# Patient Record
Sex: Female | Born: 1990 | Race: White | Hispanic: No | Marital: Single | Smoking: Never smoker
Health system: Southern US, Community
[De-identification: ages and names within clinical notes are randomized; demographics above are authoritative.]

## PROBLEM LIST (undated history)

## (undated) ENCOUNTER — Inpatient Hospital Stay (HOSPITAL_COMMUNITY): Payer: Self-pay

## (undated) DIAGNOSIS — O139 Gestational [pregnancy-induced] hypertension without significant proteinuria, unspecified trimester: Secondary | ICD-10-CM

## (undated) HISTORY — PX: DILATION AND CURETTAGE OF UTERUS: SHX78

## (undated) HISTORY — PX: WISDOM TOOTH EXTRACTION: SHX21

---
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
## 2016-06-09 DIAGNOSIS — Z6791 Unspecified blood type, Rh negative: Secondary | ICD-10-CM

## 2016-06-09 DIAGNOSIS — O26899 Other specified pregnancy related conditions, unspecified trimester: Secondary | ICD-10-CM

## 2016-06-09 HISTORY — DX: Other specified pregnancy related conditions, unspecified trimester: O26.899

## 2016-06-09 HISTORY — DX: Unspecified blood type, rh negative: Z67.91

## 2016-09-26 ENCOUNTER — Other Ambulatory Visit (HOSPITAL_COMMUNITY): Payer: Self-pay | Admitting: *Deleted

## 2016-09-26 ENCOUNTER — Encounter (HOSPITAL_COMMUNITY): Payer: Self-pay

## 2016-09-26 ENCOUNTER — Ambulatory Visit (HOSPITAL_COMMUNITY)
Admission: RE | Admit: 2016-09-26 | Discharge: 2016-09-26 | Disposition: A | Discharge status: Home / Self Care | Payer: Commercial Managed Care - PPO | Source: Ambulatory Visit | Attending: Obstetrics and Gynecology | Admitting: Obstetrics and Gynecology

## 2016-09-26 ENCOUNTER — Other Ambulatory Visit (HOSPITAL_COMMUNITY): Payer: Self-pay | Admitting: Obstetrics and Gynecology

## 2016-09-26 DIAGNOSIS — Z3689 Encounter for other specified antenatal screening: Secondary | ICD-10-CM | POA: Insufficient documentation

## 2016-09-26 DIAGNOSIS — O26872 Cervical shortening, second trimester: Secondary | ICD-10-CM

## 2016-09-26 DIAGNOSIS — O30042 Twin pregnancy, dichorionic/diamniotic, second trimester: Secondary | ICD-10-CM | POA: Diagnosis not present

## 2016-09-26 DIAGNOSIS — Z369 Encounter for antenatal screening, unspecified: Secondary | ICD-10-CM

## 2016-09-26 DIAGNOSIS — Z3A23 23 weeks gestation of pregnancy: Secondary | ICD-10-CM | POA: Insufficient documentation

## 2016-09-26 DIAGNOSIS — O99212 Obesity complicating pregnancy, second trimester: Secondary | ICD-10-CM | POA: Insufficient documentation

## 2016-09-26 DIAGNOSIS — O30049 Twin pregnancy, dichorionic/diamniotic, unspecified trimester: Secondary | ICD-10-CM

## 2016-09-26 DIAGNOSIS — O26879 Cervical shortening, unspecified trimester: Secondary | ICD-10-CM

## 2016-09-26 HISTORY — DX: Cervical shortening, unspecified trimester: O26.879

## 2016-09-26 NOTE — Progress Notes (Signed)
Maternal Fetal Medicine Consultation  Requesting Provider(s): Mannino  Primary OB: [facility] ([facility]) Reason for consultation: diamniotic dichorionic twin gestation with short cervix  HPI: 26yo P0010 at 23+3 weeks with diamniotic dichorionic twin gestation who was seen for routine PNV and scan in her primary OB's office and no measurable cervix was seen on US. Emergency US and consultation was scheduled for today. She reports increasing pelvic pressure that comes and goes but no recognizable uterine cramps or contractions. OB History: OB History    Gravida Para Term Preterm AB Living   2       1 0   SAB TAB Ectopic Multiple Live Births   1              PMH: No past medical history on file.  PSH:  Past Surgical History:  Procedure Laterality Date  . DILATION AND CURETTAGE OF UTERUS    . 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 No tobacco, alcohol or drugs  Review of Systems: no vaginal bleeding or cramping/contractions, no LOF, no nausea/vomiting. All other systems reviewed and are negative.  PE:  VS: BP  125/79                  pulse   117                Weight 228.4 lb GEN: well-appearing female ABD: gravid, NT  Please see separate document for fetal ultrasound report.  A/P:Diamniotic dichorionic twin gestation with dynamic cervix The cervix measured between 6mm and 18mm in our transvaginal evaluation. Given the patient's reported symptoms, increased uterine activity is most likely the cause for the cervical changes. I have requested that she go to modified bedrest (20/24 hours as supine as possible), begin a 3-day course of ibuprofen 600mg  q6h, begin vaginal micronized progesterone 200mg  qHS, and procardia 10mg  po q6h. Given her symptoms, her twin gestation, and her late EGA she is not really a candidate for cerclage placement We will repeat her transvaginal evaluation in 1 week.  If her cervix becomes consistently shorter, especial if <5-6710mm, inpatient evaluation and management can be considered  Thank you for the opportunity to be a part of the care of Smithfield FoodsHolly Stallone. Please contact our office if we can be of further assistance.   I spent approximately 30 minutes with this patient with over 50% of time spent in face-to-face counseling.

## 2016-10-03 ENCOUNTER — Encounter (HOSPITAL_COMMUNITY): Payer: Self-pay

## 2016-10-03 ENCOUNTER — Ambulatory Visit (HOSPITAL_COMMUNITY)
Admission: RE | Admit: 2016-10-03 | Discharge: 2016-10-03 | Disposition: A | Discharge status: Home / Self Care | Payer: Commercial Managed Care - PPO | Source: Ambulatory Visit | Attending: Obstetrics and Gynecology | Admitting: Obstetrics and Gynecology

## 2016-10-03 ENCOUNTER — Other Ambulatory Visit (HOSPITAL_COMMUNITY): Payer: Self-pay | Admitting: Obstetrics and Gynecology

## 2016-10-03 DIAGNOSIS — O30049 Twin pregnancy, dichorionic/diamniotic, unspecified trimester: Secondary | ICD-10-CM

## 2016-10-03 DIAGNOSIS — Z6841 Body Mass Index (BMI) 40.0 and over, adult: Secondary | ICD-10-CM | POA: Insufficient documentation

## 2016-10-03 DIAGNOSIS — E669 Obesity, unspecified: Secondary | ICD-10-CM | POA: Diagnosis not present

## 2016-10-03 DIAGNOSIS — O26872 Cervical shortening, second trimester: Secondary | ICD-10-CM

## 2016-10-03 DIAGNOSIS — O99212 Obesity complicating pregnancy, second trimester: Secondary | ICD-10-CM | POA: Diagnosis not present

## 2016-10-03 DIAGNOSIS — Z3A24 24 weeks gestation of pregnancy: Secondary | ICD-10-CM

## 2016-10-03 DIAGNOSIS — O30042 Twin pregnancy, dichorionic/diamniotic, second trimester: Secondary | ICD-10-CM | POA: Insufficient documentation

## 2016-10-03 NOTE — Addendum Note (Signed)
Encounter addended by: Levonne HubertStalter, Osamu Olguin M on: 10/03/2016  4:28 PM<BR>    Actions taken: Imaging Exam ended

## 2016-10-10 ENCOUNTER — Encounter (HOSPITAL_COMMUNITY): Payer: Self-pay

## 2016-10-10 ENCOUNTER — Ambulatory Visit (HOSPITAL_COMMUNITY)
Admission: RE | Admit: 2016-10-10 | Discharge: 2016-10-10 | Disposition: A | Discharge status: Home / Self Care | Payer: Commercial Managed Care - PPO | Source: Ambulatory Visit | Attending: Obstetrics and Gynecology | Admitting: Obstetrics and Gynecology

## 2016-10-10 DIAGNOSIS — O30042 Twin pregnancy, dichorionic/diamniotic, second trimester: Secondary | ICD-10-CM | POA: Insufficient documentation

## 2016-10-10 DIAGNOSIS — O26872 Cervical shortening, second trimester: Secondary | ICD-10-CM | POA: Diagnosis present

## 2016-10-10 DIAGNOSIS — O09892 Supervision of other high risk pregnancies, second trimester: Secondary | ICD-10-CM | POA: Insufficient documentation

## 2016-10-10 DIAGNOSIS — O30049 Twin pregnancy, dichorionic/diamniotic, unspecified trimester: Secondary | ICD-10-CM

## 2016-10-10 DIAGNOSIS — Z3A25 25 weeks gestation of pregnancy: Secondary | ICD-10-CM | POA: Diagnosis present

## 2016-10-10 DIAGNOSIS — O99212 Obesity complicating pregnancy, second trimester: Secondary | ICD-10-CM | POA: Diagnosis not present

## 2016-10-17 ENCOUNTER — Other Ambulatory Visit (HOSPITAL_COMMUNITY): Payer: Self-pay | Admitting: Obstetrics and Gynecology

## 2016-10-17 ENCOUNTER — Ambulatory Visit (HOSPITAL_COMMUNITY)
Admission: RE | Admit: 2016-10-17 | Discharge: 2016-10-17 | Disposition: A | Discharge status: Home / Self Care | Payer: Commercial Managed Care - PPO | Source: Ambulatory Visit | Attending: Obstetrics and Gynecology | Admitting: Obstetrics and Gynecology

## 2016-10-17 ENCOUNTER — Encounter (HOSPITAL_COMMUNITY): Payer: Self-pay

## 2016-10-17 DIAGNOSIS — O30049 Twin pregnancy, dichorionic/diamniotic, unspecified trimester: Secondary | ICD-10-CM

## 2016-10-17 DIAGNOSIS — Z3A26 26 weeks gestation of pregnancy: Secondary | ICD-10-CM | POA: Insufficient documentation

## 2016-10-17 DIAGNOSIS — O30042 Twin pregnancy, dichorionic/diamniotic, second trimester: Secondary | ICD-10-CM | POA: Diagnosis present

## 2016-10-17 DIAGNOSIS — N883 Incompetence of cervix uteri: Secondary | ICD-10-CM

## 2016-10-19 ENCOUNTER — Other Ambulatory Visit (HOSPITAL_COMMUNITY): Payer: Self-pay | Admitting: *Deleted

## 2016-10-19 DIAGNOSIS — O30049 Twin pregnancy, dichorionic/diamniotic, unspecified trimester: Secondary | ICD-10-CM

## 2016-10-19 DIAGNOSIS — O26879 Cervical shortening, unspecified trimester: Secondary | ICD-10-CM

## 2016-10-24 ENCOUNTER — Encounter (HOSPITAL_COMMUNITY): Payer: Self-pay

## 2016-10-24 ENCOUNTER — Ambulatory Visit (HOSPITAL_COMMUNITY)
Admission: RE | Admit: 2016-10-24 | Discharge: 2016-10-24 | Disposition: A | Discharge status: Home / Self Care | Payer: Commercial Managed Care - PPO | Source: Ambulatory Visit | Attending: Obstetrics and Gynecology | Admitting: Obstetrics and Gynecology

## 2016-10-24 ENCOUNTER — Inpatient Hospital Stay (HOSPITAL_COMMUNITY)
Admission: AD | Admit: 2016-10-24 | Discharge: 2016-10-24 | Disposition: A | Discharge status: Home / Self Care | Payer: Commercial Managed Care - PPO | Source: Ambulatory Visit | Attending: Family Medicine | Admitting: Family Medicine

## 2016-10-24 DIAGNOSIS — O09892 Supervision of other high risk pregnancies, second trimester: Secondary | ICD-10-CM | POA: Diagnosis present

## 2016-10-24 DIAGNOSIS — O26872 Cervical shortening, second trimester: Secondary | ICD-10-CM

## 2016-10-24 DIAGNOSIS — O30042 Twin pregnancy, dichorionic/diamniotic, second trimester: Secondary | ICD-10-CM

## 2016-10-24 DIAGNOSIS — O26879 Cervical shortening, unspecified trimester: Secondary | ICD-10-CM

## 2016-10-24 DIAGNOSIS — O30049 Twin pregnancy, dichorionic/diamniotic, unspecified trimester: Secondary | ICD-10-CM

## 2016-10-24 DIAGNOSIS — O99212 Obesity complicating pregnancy, second trimester: Secondary | ICD-10-CM | POA: Insufficient documentation

## 2016-10-24 DIAGNOSIS — O1492 Unspecified pre-eclampsia, second trimester: Secondary | ICD-10-CM

## 2016-10-24 DIAGNOSIS — Z3A27 27 weeks gestation of pregnancy: Secondary | ICD-10-CM

## 2016-10-24 DIAGNOSIS — O132 Gestational [pregnancy-induced] hypertension without significant proteinuria, second trimester: Secondary | ICD-10-CM | POA: Diagnosis not present

## 2016-10-24 HISTORY — DX: Gestational (pregnancy-induced) hypertension without significant proteinuria, unspecified trimester: O13.9

## 2016-10-24 LAB — CBC
HCT: 36.9 % (ref 36.0–46.0)
HEMOGLOBIN: 12.9 g/dL (ref 12.0–15.0)
MCH: 30.5 pg (ref 26.0–34.0)
MCHC: 35 g/dL (ref 30.0–36.0)
MCV: 87.2 fL (ref 78.0–100.0)
PLATELETS: 254 10*3/uL (ref 150–400)
RBC: 4.23 MIL/uL (ref 3.87–5.11)
RDW: 14.2 % (ref 11.5–15.5)
WBC: 13.6 10*3/uL — AB (ref 4.0–10.5)

## 2016-10-24 LAB — COMPREHENSIVE METABOLIC PANEL
ALT: 12 U/L — AB (ref 14–54)
ANION GAP: 11 (ref 5–15)
AST: 19 U/L (ref 15–41)
Albumin: 2.9 g/dL — ABNORMAL LOW (ref 3.5–5.0)
Alkaline Phosphatase: 128 U/L — ABNORMAL HIGH (ref 38–126)
BUN: 9 mg/dL (ref 6–20)
CALCIUM: 9 mg/dL (ref 8.9–10.3)
CHLORIDE: 105 mmol/L (ref 101–111)
CO2: 20 mmol/L — AB (ref 22–32)
CREATININE: 0.63 mg/dL (ref 0.44–1.00)
Glucose, Bld: 195 mg/dL — ABNORMAL HIGH (ref 65–99)
Potassium: 4.4 mmol/L (ref 3.5–5.1)
SODIUM: 136 mmol/L (ref 135–145)
Total Bilirubin: 0.2 mg/dL — ABNORMAL LOW (ref 0.3–1.2)
Total Protein: 6.4 g/dL — ABNORMAL LOW (ref 6.5–8.1)

## 2016-10-24 LAB — PROTEIN / CREATININE RATIO, URINE
CREATININE, URINE: 74 mg/dL
PROTEIN CREATININE RATIO: 0.3 mg/mg{creat} — AB (ref 0.00–0.15)
Total Protein, Urine: 22 mg/dL

## 2016-10-24 MED ORDER — LACTATED RINGERS IV SOLN
INTRAVENOUS | Status: DC | PRN
  Administered 2016-10-24: 17:00:00 via INTRAVENOUS

## 2016-10-24 MED ORDER — HYDRALAZINE HCL 20 MG/ML IJ SOLN: 10.0000 mg | Freq: Once | INTRAMUSCULAR | Status: DC | PRN

## 2016-10-24 MED ORDER — LABETALOL HCL 5 MG/ML IV SOLN: 20.0000 mg | INTRAVENOUS | Status: DC | PRN

## 2016-10-24 MED ORDER — BETAMETHASONE SOD PHOS & ACET 6 (3-3) MG/ML IJ SUSP
12.0000 mg | Freq: Once | INTRAMUSCULAR | Status: AC
  Administered 2016-10-24: 12 mg via INTRAMUSCULAR
  Filled 2016-10-24: qty 2

## 2016-10-24 NOTE — ED Notes (Signed)
Report called to [facility], RN in MAU.  Pt/family member ambulated and signed into MAU per Dr. Ezzard Standing for further evaluation.

## 2016-10-24 NOTE — MAU Provider Note (Signed)
History     CSN: [id number]  Arrival date and time: 10/24/16 1559  First Provider Initiated Contact with Patient 10/24/16 1653      Chief Complaint  Patient presents with  . Hypertension   HPI Anita Peters is a 26 y.o. G2P0010 at [redacted]w[redacted]d with di/di twins who presents for BP evaluation. Goes to [employer] in [place]. Was in MFM today for ultrasound for twin pregnancy & cervical insufficiency. Has severe range BP in MFM & brought to MAU for labs. Denies history of hypertension. Has elevated BP in office this morning for first time. Reports mild headache this morning that resolved after tylenol. Denies headache, visual disturbance, or epigastric pain.   OB History    Gravida Para Term Preterm AB Living   2       1 0   SAB TAB Ectopic Multiple Live Births   1              Past Medical History:  Diagnosis Date  . Pregnancy induced hypertension     Past Surgical History:  Procedure Laterality Date  . DILATION AND CURETTAGE OF UTERUS    . WISDOM TOOTH EXTRACTION      Family History  Problem Relation Age of Onset  . Diabetes Mother   . Diabetes Maternal Uncle   . Diabetes Maternal Grandmother     Social History  Substance Use Topics  . Smoking status: Never Smoker  . Smokeless tobacco: Never Used  . Alcohol use No    Allergies: No Known Allergies  Prescriptions Prior to Admission  Medication Sig Dispense Refill Last Dose  . acetaminophen (TYLENOL) 500 MG tablet Take 500 mg by mouth every 6 (six) hours as needed for moderate pain.   10/24/2016 at Unknown time  . calcium carbonate (TUMS - DOSED IN MG ELEMENTAL CALCIUM) 500 MG chewable tablet Chew 1 tablet by mouth 3 (three) times daily as needed for indigestion or heartburn.   Past Week at Unknown time  . docusate sodium (COLACE) 100 MG capsule Take 100 mg by mouth daily.   10/23/2016 at Unknown time  . esomeprazole (NEXIUM) 40 MG capsule Take 40 mg by mouth daily.  1 10/24/2016 at Unknown time  . NIFEdipine (PROCARDIA) 10 MG  capsule Take 10 mg by mouth QID.   10/24/2016 at 1000  . Prenatal Vit w/Fe-Methylfol-FA (PNV PO) Take 1 tablet by mouth daily.    10/23/2016 at Unknown time  . progesterone (ENDOMETRIN) 100 MG vaginal insert Place 100 mg vaginally at bedtime.    10/23/2016 at Unknown time  . valACYclovir (VALTREX) 500 MG tablet Take 500 mg by mouth daily.   10/24/2016 at Unknown time    Review of Systems  Constitutional: Negative.   Eyes: Negative for visual disturbance.  Respiratory: Negative for shortness of breath.   Cardiovascular: Positive for leg swelling. Negative for chest pain.  Gastrointestinal: Negative.   Genitourinary: Negative.   Neurological: Negative for headaches (1 h/a this AM that resolved w/meds).   Physical Exam   Blood pressure 131/75, pulse (!) 119, temperature 98.6 F (37 C), temperature source Oral, resp. rate 20, height 5\' 3"  (1.6 m), weight 244 lb (110.7 kg), last menstrual period 04/15/2016, SpO2 98 %. Patient Vitals for the past 24 hrs:  BP Temp Temp src Pulse Resp SpO2 Height Weight  10/24/16 1801 131/75 - - (!) 119 - - - -  10/24/16 1745 138/68 - - (!) 119 - 98 % - -  10/24/16 1731 (!) 145/74 - - Marland Kitchen)  123 - - - -  10/24/16 1716 (!) 149/73 - - (!) 114 - - - -  10/24/16 1701 (!) 148/93 - - (!) 118 - - - -  10/24/16 1645 (!) 145/82 - - (!) 121 - 99 % - -  10/24/16 1635 (!) 143/75 98.6 F (37 C) Oral (!) 119 20 - 5\' 3"  (1.6 m) 244 lb (110.7 kg)  10/24/16 1622 (!) 150/92 - - (!) 123 - - - -    Physical Exam  Nursing note and vitals reviewed. Constitutional: She is oriented to person, place, and time. She appears well-developed and well-nourished. No distress.  HENT:  Head: Normocephalic and atraumatic.  Eyes: Conjunctivae are normal. Right eye exhibits no discharge. Left eye exhibits no discharge. No scleral icterus.  Neck: Normal range of motion.  Cardiovascular: Normal rate, regular rhythm and normal heart sounds.   No murmur heard. Respiratory: Effort normal and breath  sounds normal. No respiratory distress. She has no wheezes.  GI: Soft. Bowel sounds are normal. There is no tenderness.  Musculoskeletal: She exhibits edema (BLE 2+ edema).  Neurological: She is alert and oriented to person, place, and time. She has normal reflexes.  No clonus  Skin: Skin is warm and dry. She is not diaphoretic.  Psychiatric: She has a normal mood and affect. Her behavior is normal. Judgment and thought content normal.   Fetal Tracing: Baby A Baseline: 145 Variability: moderate Accelerations: 10x10 Decelerations: variable  Baby B Baseline: 150 Variability: moderate Accelerations:10x10 Decelerations: variable  Toco: none  MAU Course  Procedures Results for orders placed or performed during the hospital encounter of 10/24/16 (from the past 24 hour(s))  Protein / creatinine ratio, urine     Status: Abnormal   Collection Time: 10/24/16  4:10 PM  Result Value Ref Range   Creatinine, Urine 74.00 mg/dL   Total Protein, Urine 22 mg/dL   Protein Creatinine Ratio 0.30 (H) 0.00 - 0.15 mg/mg[Cre]  Comprehensive metabolic panel     Status: Abnormal   Collection Time: 10/24/16  4:12 PM  Result Value Ref Range   Sodium 136 135 - 145 mmol/L   Potassium 4.4 3.5 - 5.1 mmol/L   Chloride 105 101 - 111 mmol/L   CO2 20 (L) 22 - 32 mmol/L   Glucose, Bld 195 (H) 65 - 99 mg/dL   BUN 9 6 - 20 mg/dL   Creatinine, Ser 7.82 0.44 - 1.00 mg/dL   Calcium 9.0 8.9 - 95.6 mg/dL   Total Protein 6.4 (L) 6.5 - 8.1 g/dL   Albumin 2.9 (L) 3.5 - 5.0 g/dL   AST 19 15 - 41 U/L   ALT 12 (L) 14 - 54 U/L   Alkaline Phosphatase 128 (H) 38 - 126 U/L   Total Bilirubin 0.2 (L) 0.3 - 1.2 mg/dL   GFR calc non Af Amer >60 >60 mL/min   GFR calc Af Amer >60 >60 mL/min   Anion gap 11 5 - 15  CBC     Status: Abnormal   Collection Time: 10/24/16  4:12 PM  Result Value Ref Range   WBC 13.6 (H) 4.0 - 10.5 K/uL   RBC 4.23 3.87 - 5.11 MIL/uL   Hemoglobin 12.9 12.0 - 15.0 g/dL   HCT 21.3 08.6 - 57.8 %    MCV 87.2 78.0 - 100.0 fL   MCH 30.5 26.0 - 34.0 pg   MCHC 35.0 30.0 - 36.0 g/dL   RDW 46.9 62.9 - 52.8 %   Platelets 254 150 -  400 K/uL    MDM Elevated BPs; none severe range in BP Patient asymptomatic Urine PCR 0.30. Reviewed patient & labs with Dr. Macon Large. Patient would like to be discharged home & is stable at this time. Will give BMZ & discharge home with close f/u. Patient to return tomorrow for 2nd BMZ & BP check.  Assessment and Plan  A: 1. Pre-eclampsia in second trimester   2. Dichorionic diamniotic twin pregnancy in second trimester    P: Discharge home Return to MAU in 24 hrs for BMZ & BP check Discussed reasons to return to MAU including s/s worsening preeclampsia and/or PTL  Judeth Horn 10/24/2016, 4:52 PM

## 2016-10-24 NOTE — MAU Note (Signed)
Sent down from MFM from further eval, BP elevated, HA x2days. Increased swelling.  preg with twins, hx of short cervix.  Labs were done at appt today ([facility] in [place])

## 2016-10-24 NOTE — Discharge Instructions (Signed)
Preeclampsia and Eclampsia °Preeclampsia is a serious condition that develops only during pregnancy. It is also called toxemia of pregnancy. This condition causes high blood pressure along with other symptoms, such as swelling and headaches. These symptoms may develop as the condition gets worse. Preeclampsia may occur at 20 weeks of pregnancy or later. °Diagnosing and treating preeclampsia early is very important. If not treated early, it can cause serious problems for you and your baby. One problem it can lead to is eclampsia, which is a condition that causes muscle jerking or shaking (convulsions or seizures) in the mother. Delivering your baby is the best treatment for preeclampsia or eclampsia. Preeclampsia and eclampsia symptoms usually go away after your baby is born. °What are the causes? °The cause of preeclampsia is not known. °What increases the risk? °The following risk factors make you more likely to develop preeclampsia: °· Being pregnant for the first time. °· Having had preeclampsia during a past pregnancy. °· Having a family history of preeclampsia. °· Having high blood pressure. °· Being pregnant with twins or triplets. °· Being 35 or older. °· Being African-American. °· Having kidney disease or diabetes. °· Having medical conditions such as lupus or blood diseases. °· Being very overweight (obese). ° °What are the signs or symptoms? °The earliest signs of preeclampsia are: °· High blood pressure. °· Increased protein in your urine. Your health care provider will check for this at every visit before you give birth (prenatal visit). ° °Other symptoms that may develop as the condition gets worse include: °· Severe headaches. °· Sudden weight gain. °· Swelling of the hands, face, legs, and feet. °· Nausea and vomiting. °· Vision problems, such as blurred or double vision. °· Numbness in the face, arms, legs, and feet. °· Urinating less than usual. °· Dizziness. °· Slurred speech. °· Abdominal pain,  especially upper abdominal pain. °· Convulsions or seizures. ° °Symptoms generally go away after giving birth. °How is this diagnosed? °There are no screening tests for preeclampsia. Your health care provider will ask you about symptoms and check for signs of preeclampsia during your prenatal visits. You may also have tests that include: °· Urine tests. °· Blood tests. °· Checking your blood pressure. °· Monitoring your baby’s heart rate. °· Ultrasound. ° °How is this treated? °You and your health care provider will determine the treatment approach that is best for you. Treatment may include: °· Having more frequent prenatal exams to check for signs of preeclampsia, if you have an increased risk for preeclampsia. °· Bed rest. °· Reducing how much salt (sodium) you eat. °· Medicine to lower your blood pressure. °· Staying in the hospital, if your condition is severe. There, treatment will focus on controlling your blood pressure and the amount of fluids in your body (fluid retention). °· You may need to take medicine (magnesium sulfate) to prevent seizures. This medicine may be given as an injection or through an IV tube. °· Delivering your baby early, if your condition gets worse. You may have your labor started with medicine (induced), or you may have a cesarean delivery. ° °Follow these instructions at home: °Eating and drinking ° °· Drink enough fluid to keep your urine clear or pale yellow. °· Eat a healthy diet that is low in sodium. Do not add salt to your food. Check nutrition labels to see how much sodium a food or beverage contains. °· Avoid caffeine. °Lifestyle °· Do not use any products that contain nicotine or tobacco, such as cigarettes   and e-cigarettes. If you need help quitting, ask your health care provider. °· Do not use alcohol or drugs. °· Avoid stress as much as possible. Rest and get plenty of sleep. °General instructions °· Take over-the-counter and prescription medicines only as told by your  health care provider. °· When lying down, lie on your side. This keeps pressure off of your baby. °· When sitting or lying down, raise (elevate) your feet. Try putting some pillows underneath your lower legs. °· Exercise regularly. Ask your health care provider what kinds of exercise are best for you. °· Keep all follow-up and prenatal visits as told by your health care provider. This is important. °How is this prevented? °To prevent preeclampsia or eclampsia from developing during another pregnancy: °· Get proper medical care during pregnancy. Your health care provider may be able to prevent preeclampsia or diagnose and treat it early. °· Your health care provider may have you take a low-dose aspirin or a calcium supplement during your next pregnancy. °· You may have tests of your blood pressure and kidney function after giving birth. °· Maintain a healthy weight. Ask your health care provider for help managing weight gain during pregnancy. °· Work with your health care provider to manage any long-term (chronic) health conditions you have, such as diabetes or kidney problems. ° °Contact a health care provider if: °· You gain more weight than expected. °· You have headaches. °· You have nausea or vomiting. °· You have abdominal pain. °· You feel dizzy or light-headed. °Get help right away if: °· You develop sudden or severe swelling anywhere in your body. This usually happens in the legs. °· You gain 5 lbs (2.3 kg) or more during one week. °· You have severe: °? Abdominal pain. °? Headaches. °? Dizziness. °? Vision problems. °? Confusion. °? Nausea or vomiting. °· You have a seizure. °· You have trouble moving any part of your body. °· You develop numbness in any part of your body. °· You have trouble speaking. °· You have any abnormal bleeding. °· You pass out. °This information is not intended to replace advice given to you by your health care provider. Make sure you discuss any questions you have with your health  care provider. °Document Released: 02/11/2000 Document Revised: 10/12/2015 Document Reviewed: 09/20/2015 °Elsevier Interactive Patient Education © 2018 [employer] ° °

## 2016-10-25 ENCOUNTER — Encounter (HOSPITAL_COMMUNITY): Payer: Self-pay

## 2016-10-25 ENCOUNTER — Inpatient Hospital Stay (HOSPITAL_COMMUNITY)
Admission: AD | Admit: 2016-10-25 | Discharge: 2016-10-25 | Disposition: A | Discharge status: Home / Self Care | Payer: Commercial Managed Care - PPO | Source: Ambulatory Visit | Attending: Obstetrics and Gynecology | Admitting: Obstetrics and Gynecology

## 2016-10-25 DIAGNOSIS — Z3A27 27 weeks gestation of pregnancy: Secondary | ICD-10-CM | POA: Diagnosis not present

## 2016-10-25 DIAGNOSIS — Z3689 Encounter for other specified antenatal screening: Secondary | ICD-10-CM | POA: Diagnosis not present

## 2016-10-25 DIAGNOSIS — O30042 Twin pregnancy, dichorionic/diamniotic, second trimester: Secondary | ICD-10-CM | POA: Diagnosis present

## 2016-10-25 DIAGNOSIS — O1492 Unspecified pre-eclampsia, second trimester: Secondary | ICD-10-CM | POA: Diagnosis not present

## 2016-10-25 MED ORDER — BETAMETHASONE SOD PHOS & ACET 6 (3-3) MG/ML IJ SUSP
12.0000 mg | Freq: Once | INTRAMUSCULAR | Status: AC
  Administered 2016-10-25: 12 mg via INTRAMUSCULAR
  Filled 2016-10-25: qty 2

## 2016-10-25 NOTE — MAU Note (Signed)
Urine in lab 

## 2016-10-25 NOTE — MAU Note (Signed)
Pt here for 2nd BMZ injection & BP check.  Denies pain, HA, or blurred vision.  No contractions, bleeding, or LOF.

## 2016-10-25 NOTE — Discharge Instructions (Signed)
Preeclampsia and Eclampsia °Preeclampsia is a serious condition that develops only during pregnancy. It is also called toxemia of pregnancy. This condition causes high blood pressure along with other symptoms, such as swelling and headaches. These symptoms may develop as the condition gets worse. Preeclampsia may occur at 20 weeks of pregnancy or later. °Diagnosing and treating preeclampsia early is very important. If not treated early, it can cause serious problems for you and your baby. One problem it can lead to is eclampsia, which is a condition that causes muscle jerking or shaking (convulsions or seizures) in the mother. Delivering your baby is the best treatment for preeclampsia or eclampsia. Preeclampsia and eclampsia symptoms usually go away after your baby is born. °What are the causes? °The cause of preeclampsia is not known. °What increases the risk? °The following risk factors make you more likely to develop preeclampsia: °· Being pregnant for the first time. °· Having had preeclampsia during a past pregnancy. °· Having a family history of preeclampsia. °· Having high blood pressure. °· Being pregnant with twins or triplets. °· Being 35 or older. °· Being African-American. °· Having kidney disease or diabetes. °· Having medical conditions such as lupus or blood diseases. °· Being very overweight (obese). ° °What are the signs or symptoms? °The earliest signs of preeclampsia are: °· High blood pressure. °· Increased protein in your urine. Your health care provider will check for this at every visit before you give birth (prenatal visit). ° °Other symptoms that may develop as the condition gets worse include: °· Severe headaches. °· Sudden weight gain. °· Swelling of the hands, face, legs, and feet. °· Nausea and vomiting. °· Vision problems, such as blurred or double vision. °· Numbness in the face, arms, legs, and feet. °· Urinating less than usual. °· Dizziness. °· Slurred speech. °· Abdominal pain,  especially upper abdominal pain. °· Convulsions or seizures. ° °Symptoms generally go away after giving birth. °How is this diagnosed? °There are no screening tests for preeclampsia. Your health care provider will ask you about symptoms and check for signs of preeclampsia during your prenatal visits. You may also have tests that include: °· Urine tests. °· Blood tests. °· Checking your blood pressure. °· Monitoring your baby’s heart rate. °· Ultrasound. ° °How is this treated? °You and your health care provider will determine the treatment approach that is best for you. Treatment may include: °· Having more frequent prenatal exams to check for signs of preeclampsia, if you have an increased risk for preeclampsia. °· Bed rest. °· Reducing how much salt (sodium) you eat. °· Medicine to lower your blood pressure. °· Staying in the hospital, if your condition is severe. There, treatment will focus on controlling your blood pressure and the amount of fluids in your body (fluid retention). °· You may need to take medicine (magnesium sulfate) to prevent seizures. This medicine may be given as an injection or through an IV tube. °· Delivering your baby early, if your condition gets worse. You may have your labor started with medicine (induced), or you may have a cesarean delivery. ° °Follow these instructions at home: °Eating and drinking ° °· Drink enough fluid to keep your urine clear or pale yellow. °· Eat a healthy diet that is low in sodium. Do not add salt to your food. Check nutrition labels to see how much sodium a food or beverage contains. °· Avoid caffeine. °Lifestyle °· Do not use any products that contain nicotine or tobacco, such as cigarettes   and e-cigarettes. If you need help quitting, ask your health care provider. °· Do not use alcohol or drugs. °· Avoid stress as much as possible. Rest and get plenty of sleep. °General instructions °· Take over-the-counter and prescription medicines only as told by your  health care provider. °· When lying down, lie on your side. This keeps pressure off of your baby. °· When sitting or lying down, raise (elevate) your feet. Try putting some pillows underneath your lower legs. °· Exercise regularly. Ask your health care provider what kinds of exercise are best for you. °· Keep all follow-up and prenatal visits as told by your health care provider. This is important. °How is this prevented? °To prevent preeclampsia or eclampsia from developing during another pregnancy: °· Get proper medical care during pregnancy. Your health care provider may be able to prevent preeclampsia or diagnose and treat it early. °· Your health care provider may have you take a low-dose aspirin or a calcium supplement during your next pregnancy. °· You may have tests of your blood pressure and kidney function after giving birth. °· Maintain a healthy weight. Ask your health care provider for help managing weight gain during pregnancy. °· Work with your health care provider to manage any long-term (chronic) health conditions you have, such as diabetes or kidney problems. ° °Contact a health care provider if: °· You gain more weight than expected. °· You have headaches. °· You have nausea or vomiting. °· You have abdominal pain. °· You feel dizzy or light-headed. °Get help right away if: °· You develop sudden or severe swelling anywhere in your body. This usually happens in the legs. °· You gain 5 lbs (2.3 kg) or more during one week. °· You have severe: °? Abdominal pain. °? Headaches. °? Dizziness. °? Vision problems. °? Confusion. °? Nausea or vomiting. °· You have a seizure. °· You have trouble moving any part of your body. °· You develop numbness in any part of your body. °· You have trouble speaking. °· You have any abnormal bleeding. °· You pass out. °This information is not intended to replace advice given to you by your health care provider. Make sure you discuss any questions you have with your health  care provider. °Document Released: 02/11/2000 Document Revised: 10/12/2015 Document Reviewed: 09/20/2015 °Elsevier Interactive Patient Education © 2018 [employer] ° °

## 2016-10-25 NOTE — MAU Provider Note (Signed)
History     CSN: [id number]  Arrival date and time: 10/25/16 2992   First Provider Initiated Contact with Patient 10/25/16 1936      No chief complaint on file.  G2P0010 @27 .4 wks with didi twins here for second BMZ injection. Her BP was found to elevated therefore I was asked to evaluate her. She was seen yesterday in MAU after having elevated BP in MFM and was found to have Preeclampsia dx by elevated BPs and PCR of 0.3. She denies HA, visual disturbances, epigastric pain, CP, and SOB. She reports good FM. She is receiving care at [facility] in [place].     OB History    Gravida Para Term Preterm AB Living   2       1 0   SAB TAB Ectopic Multiple Live Births   1              Past Medical History:  Diagnosis Date  . Pregnancy induced hypertension     Past Surgical History:  Procedure Laterality Date  . DILATION AND CURETTAGE OF UTERUS    . WISDOM TOOTH EXTRACTION      Family History  Problem Relation Age of Onset  . Diabetes Mother   . Diabetes Maternal Uncle   . Diabetes Maternal Grandmother     Social History  Substance Use Topics  . Smoking status: Never Smoker  . Smokeless tobacco: Never Used  . Alcohol use No    Allergies: No Known Allergies  Prescriptions Prior to Admission  Medication Sig Dispense Refill Last Dose  . acetaminophen (TYLENOL) 500 MG tablet Take 500 mg by mouth every 6 (six) hours as needed for moderate pain.   10/24/2016 at Unknown time  . calcium carbonate (TUMS - DOSED IN MG ELEMENTAL CALCIUM) 500 MG chewable tablet Chew 1 tablet by mouth 3 (three) times daily as needed for indigestion or heartburn.   Past Week at Unknown time  . docusate sodium (COLACE) 100 MG capsule Take 100 mg by mouth daily.   10/23/2016 at Unknown time  . esomeprazole (NEXIUM) 40 MG capsule Take 40 mg by mouth daily.  1 10/24/2016 at Unknown time  . NIFEdipine (PROCARDIA) 10 MG capsule Take 10 mg by mouth QID.   10/24/2016 at 1000  . Prenatal Vit  w/Fe-Methylfol-FA (PNV PO) Take 1 tablet by mouth daily.    10/23/2016 at Unknown time  . progesterone (ENDOMETRIN) 100 MG vaginal insert Place 100 mg vaginally at bedtime.    10/23/2016 at Unknown time  . valACYclovir (VALTREX) 500 MG tablet Take 500 mg by mouth daily.   10/24/2016 at Unknown time    Review of Systems  Eyes: Negative for visual disturbance.  Respiratory: Negative for shortness of breath.   Cardiovascular: Negative for chest pain.  Gastrointestinal: Negative for abdominal pain.  Genitourinary: Negative for vaginal bleeding.  Neurological: Negative for headaches.   Physical Exam   Blood pressure 136/79, pulse (!) 109, temperature 98.1 F (36.7 C), temperature source Oral, resp. rate 18, last menstrual period 04/15/2016.  Patient Vitals for the past 24 hrs:  BP Temp Temp src Pulse Resp  10/25/16 1930 136/79 - - (!) 109 -  10/25/16 1922 137/86 - - (!) 112 -  10/25/16 1845 (!) 152/88 98.1 F (36.7 C) Oral (!) 115 18    Physical Exam  Constitutional: She is oriented to person, place, and time. She appears well-developed and well-nourished. No distress.  HENT:  Head: Normocephalic and atraumatic.  Neck: Normal range  of motion.  Respiratory: Effort normal. No respiratory distress.  Musculoskeletal: Normal range of motion.  Neurological: She is alert and oriented to person, place, and time.  Skin: Skin is warm and dry.  Psychiatric: She has a normal mood and affect.   EFM-A: 145 bpm, mod variability, + accels, no decels EFM-B: 140 bpm, mod variability, + accels, no decels Toco: rare  MAU Course  Procedures Betamethasone 12 mg IM  MDM Only one elevated BP, not severe range and pt asymptomatic. Stable for discharge home.   Assessment and Plan   1. [redacted] weeks gestation of pregnancy   2. Dichorionic diamniotic twin pregnancy in second trimester   3. NST (non-stress test) reactive   4. Pre-eclampsia in second trimester    Discharge home Follow up at [facility] in  [place] in 1-2 days for BP check Pre-e precautions  Allergies as of 10/25/2016   No Known Allergies     Medication List    TAKE these medications   acetaminophen 500 MG tablet Commonly known as:  TYLENOL Take 500 mg by mouth every 6 (six) hours as needed for moderate pain.   calcium carbonate 500 MG chewable tablet Commonly known as:  TUMS - dosed in mg elemental calcium Chew 1 tablet by mouth 3 (three) times daily as needed for indigestion or heartburn.   docusate sodium 100 MG capsule Commonly known as:  COLACE Take 100 mg by mouth daily.   esomeprazole 40 MG capsule Commonly known as:  NEXIUM Take 40 mg by mouth daily.   NIFEdipine 10 MG capsule Commonly known as:  PROCARDIA Take 10 mg by mouth QID.   PNV PO Take 1 tablet by mouth daily.   progesterone 100 MG vaginal insert Commonly known as:  ENDOMETRIN Place 100 mg vaginally at bedtime.   valACYclovir 500 MG tablet Commonly known as:  VALTREX Take 500 mg by mouth daily.            Discharge Care Instructions        Start     Ordered   10/25/16 0000  Discharge patient    Question Answer Comment  Discharge disposition 01-Home or Self Care   Discharge patient date 10/25/2016      10/25/16 1952     Donette Larry, Ina Homes 10/25/2016, 7:46 PM

## 2016-10-31 ENCOUNTER — Ambulatory Visit (HOSPITAL_COMMUNITY): Payer: Commercial Managed Care - PPO

## 2016-11-06 DIAGNOSIS — O24415 Gestational diabetes mellitus in pregnancy, controlled by oral hypoglycemic drugs: Secondary | ICD-10-CM | POA: Insufficient documentation

## 2016-11-07 ENCOUNTER — Ambulatory Visit (HOSPITAL_COMMUNITY)
Admission: RE | Admit: 2016-11-07 | Discharge: 2016-11-07 | Disposition: A | Discharge status: Home / Self Care | Payer: Commercial Managed Care - PPO | Source: Ambulatory Visit | Attending: Maternal and Fetal Medicine | Admitting: Maternal and Fetal Medicine

## 2016-11-07 ENCOUNTER — Encounter (HOSPITAL_COMMUNITY): Payer: Self-pay

## 2016-11-07 DIAGNOSIS — O99212 Obesity complicating pregnancy, second trimester: Secondary | ICD-10-CM | POA: Diagnosis not present

## 2016-11-07 DIAGNOSIS — O26872 Cervical shortening, second trimester: Secondary | ICD-10-CM | POA: Diagnosis not present

## 2016-11-07 DIAGNOSIS — O26879 Cervical shortening, unspecified trimester: Secondary | ICD-10-CM

## 2016-11-07 DIAGNOSIS — Z3A29 29 weeks gestation of pregnancy: Secondary | ICD-10-CM | POA: Insufficient documentation

## 2016-11-07 DIAGNOSIS — O30042 Twin pregnancy, dichorionic/diamniotic, second trimester: Secondary | ICD-10-CM | POA: Insufficient documentation

## 2016-11-14 DIAGNOSIS — O1400 Mild to moderate pre-eclampsia, unspecified trimester: Secondary | ICD-10-CM

## 2016-11-14 HISTORY — DX: Mild to moderate pre-eclampsia, unspecified trimester: O14.00

## 2017-04-14 ENCOUNTER — Encounter (HOSPITAL_COMMUNITY): Payer: Self-pay

## 2017-09-08 IMAGING — US US MFM OB TRANSVAGINAL
1 series · 14 of 26 positions shown · non-contrast
Comparison: none

[Series 1: us mfm ob transvaginal · 26 acquisitions, 14 frames shown]
[im 1/26]
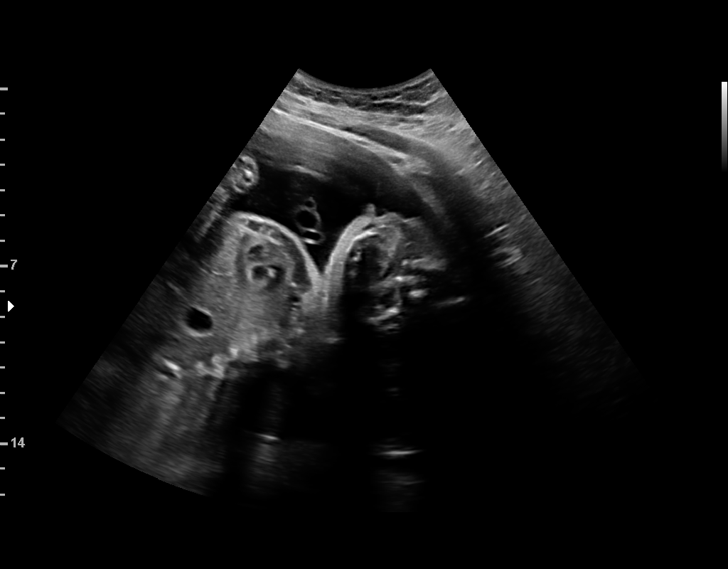
[im 3/26]
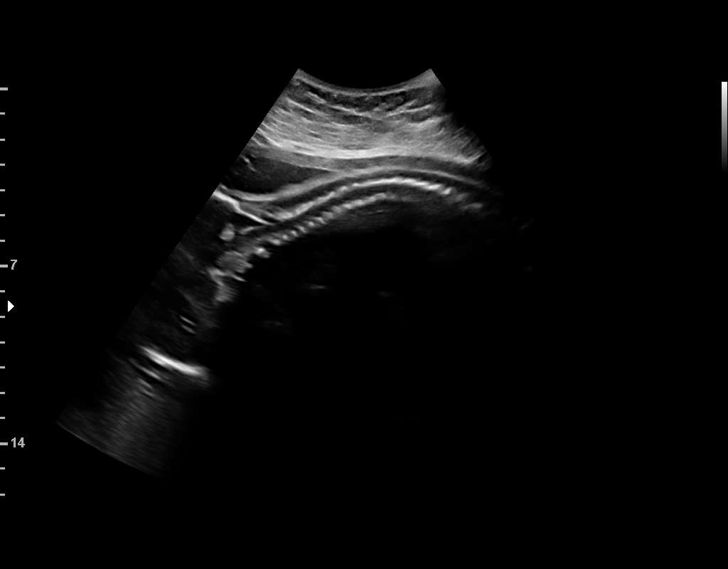
[im 5/26]
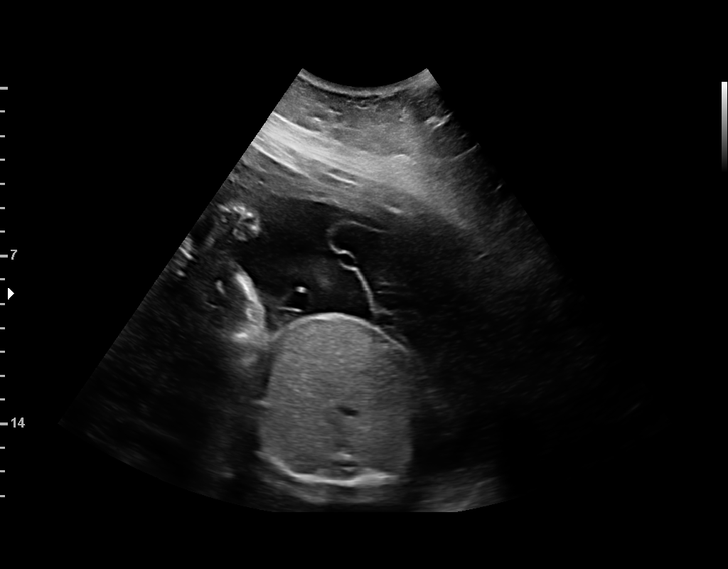
[im 7/26]
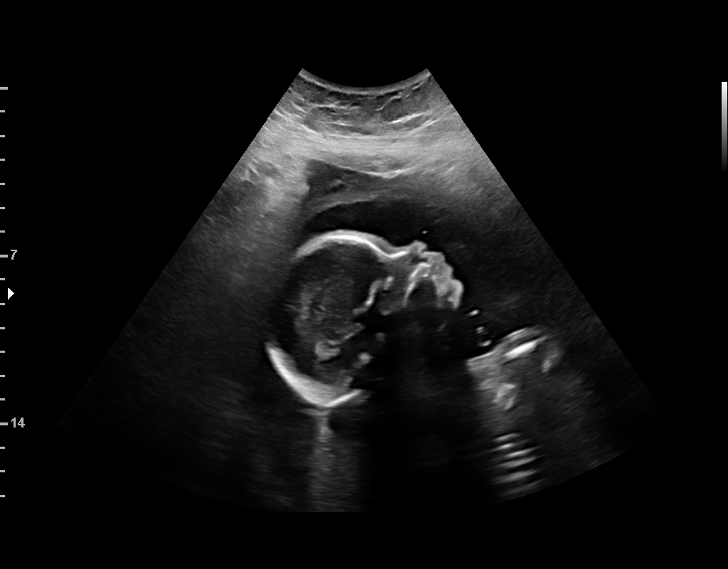
[im 9/26]
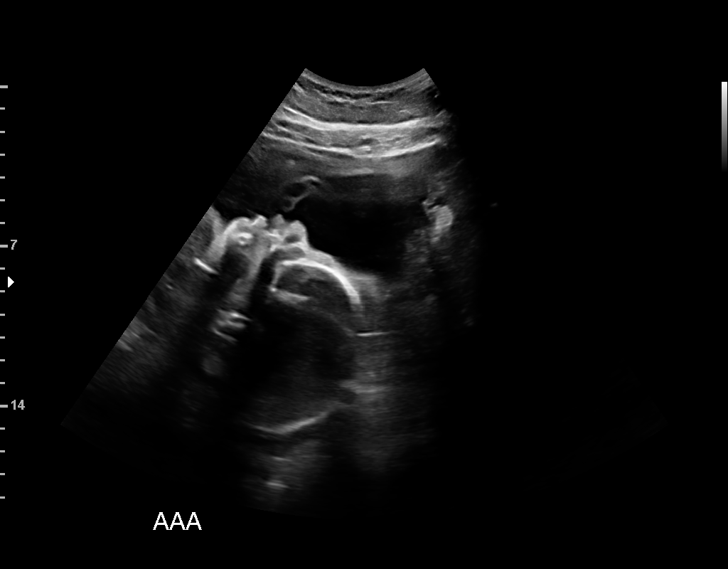
[im 11/26]
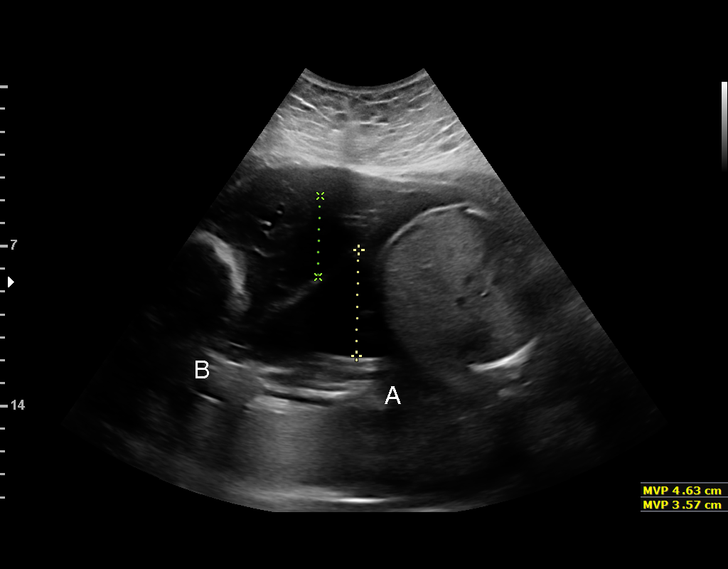
[im 13/26]
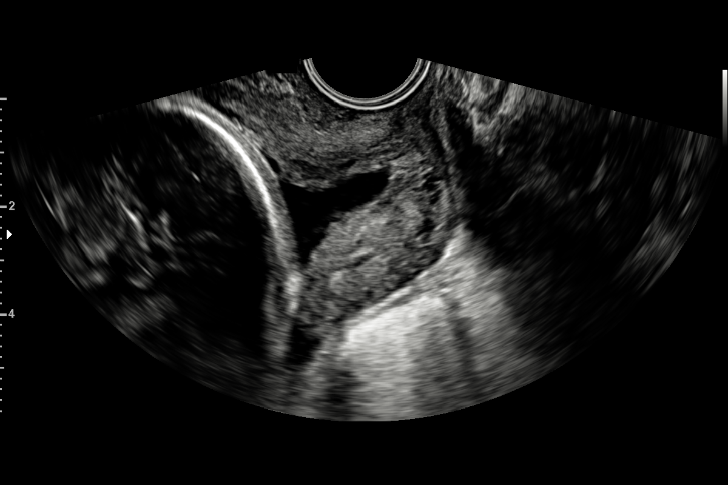
[im 14/26]
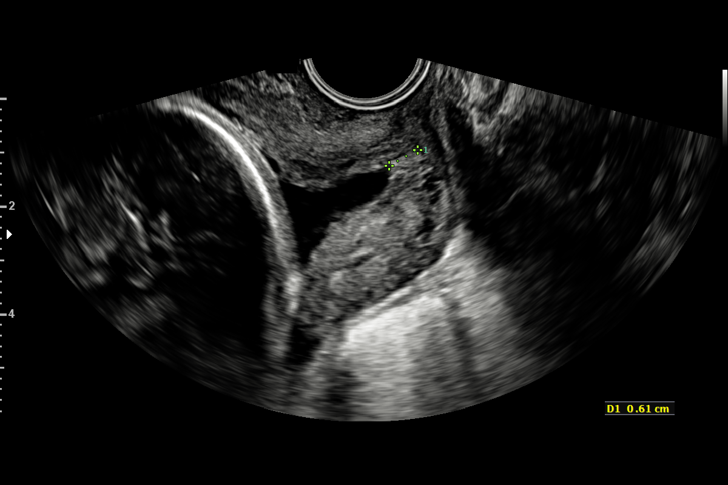
[im 16/26]
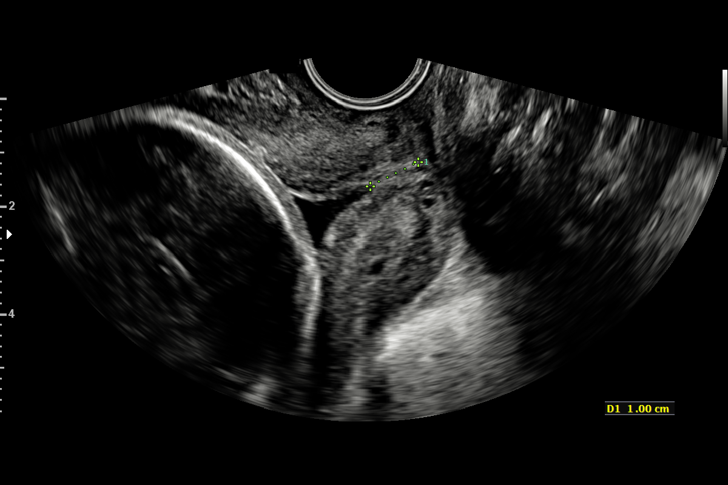
[im 18/26]
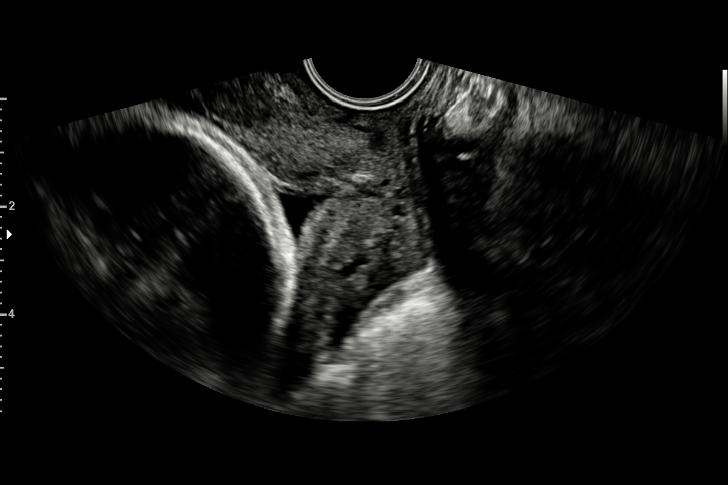
[im 20/26]
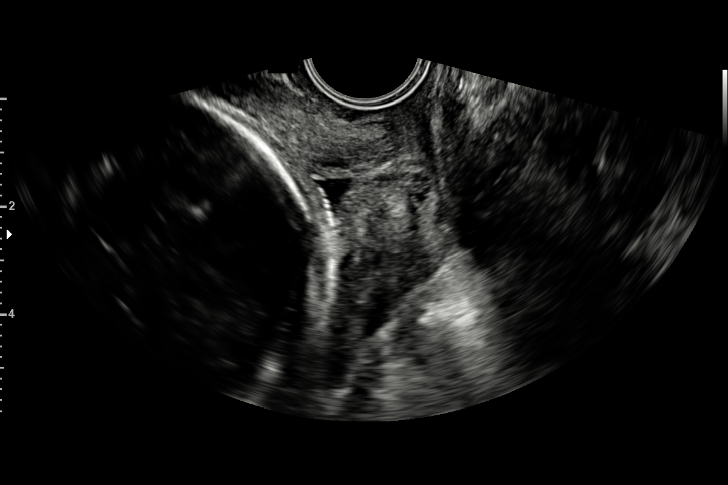
[im 22/26]
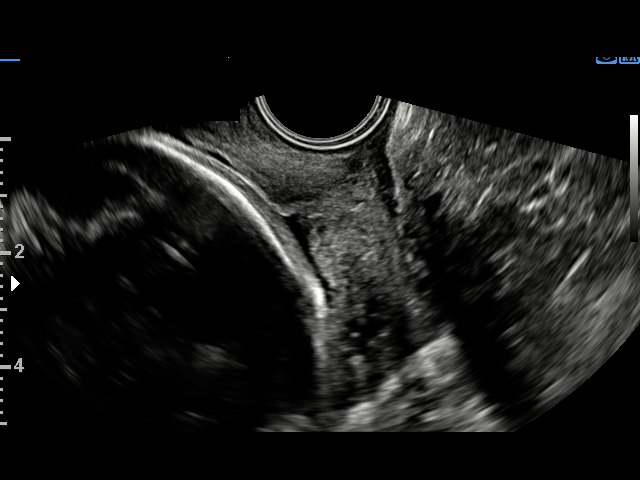
[im 24/26]
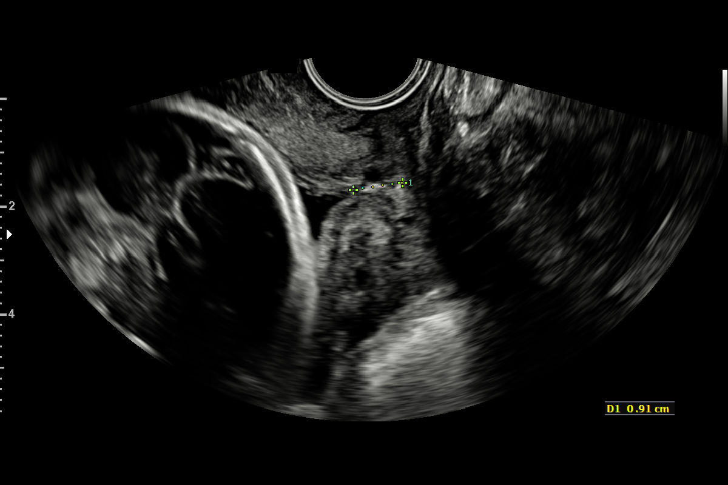
[im 26/26]
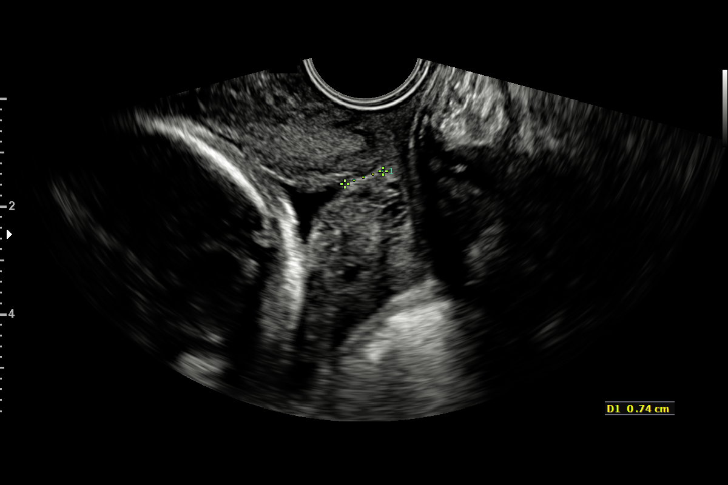

[14 of 26 positions shown; findings below may reference images not displayed]

SEMEON DO

1  KAMISHA DOLORES              [id number]      [id number]     [id number]
Indications

25 weeks gestation of pregnancy
Twin pregnancy, di/di, second trimester
Cervical shortening, second trimester
Obesity complicating pregnancy, second
trimester
OB History

Gravidity:    2         Term:   0        Prem:   0        SAB:   1
TOP:          0       Ectopic:  0        Living: 0
Fetal Evaluation (Fetus A)

Num Of Fetuses:     2
Fetal Heart         159
Rate(bpm):
Cardiac Activity:   Observed
Fetal Lie:          Lower Fetus
Presentation:       Cephalic
Membrane Desc:      Dividing Membrane seen

Amniotic Fluid
AFI FV:      Subjectively within normal limits

Largest Pocket(cm)
4.63
Gestational Age (Fetus A)

LMP:           25w 3d       Date:   04/15/16                 EDD:   01/20/17
Best:          25w 3d    Det. By:   LMP  (04/15/16)          EDD:   01/20/17
Fetal Evaluation (Fetus B)

Num Of Fetuses:     2
Fetal Heart         153
Rate(bpm):
Cardiac Activity:   Observed
Fetal Lie:          Upper Fetus
Presentation:       Transverse, head to maternal right
Membrane Desc:      Dividing Membrane seen

Amniotic Fluid
AFI FV:      Subjectively within normal limits

Largest Pocket(cm)
3.57
Gestational Age (Fetus B)

LMP:           25w 3d       Date:   04/15/16                 EDD:   01/20/17
Best:          25w 3d    Det. By:   LMP  (04/15/16)          EDD:   01/20/17
Cervix Uterus Adnexa

Cervix
Measured transvaginally. Dynamic, ranging from 0.6cm - 1.1cm.
Appears funnelled.
Impression

Diamniotic Dichorionic intrauterine pregnancy at 25+3 weeks
with short cervix
Presentation is cephalic/transverse
Normal amniotic fluid in each sac, with MVP of 4.6 cm for A
and 3.6 cm for B
Transvaginal evaluation of the cervix showed a dynamic
cervix with funnelling with lengths ranging from 8mm to 11mm
Recommendations

Cervical status not as good as last week.  Continue vaginal
progesterone and oral nifedipine and repeat transvaginal
cervical length in 1 week

## 2019-07-02 ENCOUNTER — Ambulatory Visit: Payer: Commercial Managed Care - PPO | Admitting: Legal Medicine

## 2019-07-02 ENCOUNTER — Other Ambulatory Visit: Payer: Self-pay

## 2019-07-02 ENCOUNTER — Encounter: Payer: Self-pay | Admitting: Legal Medicine

## 2019-07-02 VITALS — BP 100/86 | HR 70 | Temp 97.8°F | Resp 17 | Ht 63.0 in | Wt 217.2 lb

## 2019-07-02 DIAGNOSIS — A6004 Herpesviral vulvovaginitis: Secondary | ICD-10-CM | POA: Insufficient documentation

## 2019-07-02 DIAGNOSIS — K219 Gastro-esophageal reflux disease without esophagitis: Secondary | ICD-10-CM

## 2019-07-02 DIAGNOSIS — B009 Herpesviral infection, unspecified: Secondary | ICD-10-CM

## 2019-07-02 DIAGNOSIS — G43009 Migraine without aura, not intractable, without status migrainosus: Secondary | ICD-10-CM

## 2019-07-02 DIAGNOSIS — F902 Attention-deficit hyperactivity disorder, combined type: Secondary | ICD-10-CM

## 2019-07-02 DIAGNOSIS — F411 Generalized anxiety disorder: Secondary | ICD-10-CM

## 2019-07-02 DIAGNOSIS — N920 Excessive and frequent menstruation with regular cycle: Secondary | ICD-10-CM | POA: Insufficient documentation

## 2019-07-02 HISTORY — DX: Migraine without aura, not intractable, without status migrainosus: G43.009

## 2019-07-02 MED ORDER — PANTOPRAZOLE SODIUM 40 MG PO TBEC
40.0000 mg | DELAYED_RELEASE_TABLET | Freq: Every day | ORAL | 6 refills | Status: DC
Start: 2019-07-02 — End: 2020-09-09

## 2019-07-02 MED ORDER — NORETHINDRONE ACET-ETHINYL EST 1.5-30 MG-MCG PO TABS: 1.0000 | ORAL_TABLET | Freq: Every day | ORAL | 11 refills | Status: DC

## 2019-07-02 MED ORDER — ESCITALOPRAM OXALATE 20 MG PO TABS: 20.0000 mg | ORAL_TABLET | Freq: Every day | ORAL | 6 refills | Status: DC

## 2019-07-02 MED ORDER — AMPHETAMINE-DEXTROAMPHETAMINE 10 MG PO TABS: 10.0000 mg | ORAL_TABLET | Freq: Every day | ORAL | 0 refills | Status: DC

## 2019-07-02 MED ORDER — VALACYCLOVIR HCL 500 MG PO TABS: 500.0000 mg | ORAL_TABLET | Freq: Every day | ORAL | 6 refills | Status: DC

## 2019-07-02 NOTE — Assessment & Plan Note (Signed)
Plan of care was formulated today.  She is doing well.  A plan of care was formulated using patient exam, tests and other sources to optimize care using evidence based information.  Recommend no smoking, no eating after supper, avoid fatty foods, elevate Head of bed, avoid tight fitting clothing.  Continue on pantoprazole. 

## 2019-07-02 NOTE — Assessment & Plan Note (Signed)
She has a history of vulvar herpes but none recently, refill valtrex

## 2019-07-02 NOTE — Assessment & Plan Note (Signed)
Patient has heavy periods lasting 8 days and 3 heavy days, she was anemic at last check when she had baby and no follow up.  Check CBC and start loestrin 1.5/30 recheck in 3 months

## 2019-07-02 NOTE — Progress Notes (Signed)
Established Patient Office Visit  Subjective:  Patient ID: Anita Peters, female    DOB: 1990/07/28  Age: 29 y.o. MRN: [id number]  CC:  Chief Complaint  Patient presents with  . Gastroesophageal Reflux  . Anxiety  . ADHD    HPI Anita Peters presents for chronic visit.  Patient has gastroesophageal reflux symptoms withesophagitis and LTRD.  The symptoms are moderate intensity.  Length of symptoms 5 years.  Medicines include protonix.  Complications include none.  ADD- she is on Adderall but been off it for 6 months.  She is making mistakes at work and trouble keeping up.  She has ADD in high school. She also has anxiety and depression.  This patient has major depression for 12 months.  PHQ9 =8.  Patient is having less anhedonia.  The patient has needs improvement future plans and prospects.  The depression is worse with stress.  The patient not exercising and working on behavior to improve mental health.  Patient not seeing a therapist or psychiatrist.  na  Patient is on lexapro but out..  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  No recent flairs. Past Medical History:  Diagnosis Date  . Antepartum mild preeclampsia 11/14/2016   Formatting of this note might be different from the original. Seen for repeat BP check in triage 12/04/16 Rescue BMZ 10/8 - 10/9  HELLP labs normal/stable 12/16/16, except urine P/C 10.7 Dr. Cyndie Chime spoke with Dr. Particia Nearing (MFM) on 12/18/16 regarding patients clinical care with severe BP on 10/19 (though not 4h apart) and worsening proteinuria with urine P/C of 10.7 (previously 0.3 in August).   . Migraine without aura and without status migrainosus, not intractable 07/02/2019  . Pregnancy induced hypertension   . Rh negative state in antepartum period 06/09/2016  . Short cervix affecting pregnancy 09/26/2016    Past Surgical History:  Procedure Laterality Date  . CESAREAN SECTION  12/20/2016  . DILATION AND CURETTAGE OF UTERUS    . WISDOM  TOOTH EXTRACTION      Family History  Problem Relation Age of Onset  . Diabetes Mother   . Diabetes Maternal Uncle   . Diabetes Maternal Grandmother     Social History   Socioeconomic History  . Marital status: Single    Spouse name: Not on file  . Number of children: 2  . Years of education: Not on file  . Highest education level: Not on file  Occupational History  . Occupation: CNA  Tobacco Use  . Smoking status: Never Smoker  . Smokeless tobacco: Never Used  Substance and Sexual Activity  . Alcohol use: Yes    Alcohol/week: 2.0 standard drinks    Types: 2 Shots of liquor per week    Comment: Every 6 months  . Drug use: No  . Sexual activity: Not Currently    Birth control/protection: None  Other Topics Concern  . Not on file  Social History Narrative  . Not on file   Social Determinants of Health   Financial Resource Strain:   . Difficulty of Paying Living Expenses:   Food Insecurity:   . Worried About Programme researcher, broadcasting/film/video in the Last Year:   . Barista in the Last Year:   Transportation Needs:   . Freight forwarder (Medical):   Marland Kitchen Lack of Transportation (Non-Medical):   Physical Activity:   . Days of Exercise per Week:   . Minutes of Exercise per Session:   Stress:   .  Feeling of Stress :   Social Connections:   . Frequency of Communication with Friends and Family:   . Frequency of Social Gatherings with Friends and Family:   . Attends Religious Services:   . Active Member of Clubs or Organizations:   . Attends Banker Meetings:   Marland Kitchen Marital Status:   Intimate Partner Violence:   . Fear of Current or Ex-Partner:   . Emotionally Abused:   Marland Kitchen Physically Abused:   . Sexually Abused:     Outpatient Medications Prior to Visit  Medication Sig Dispense Refill  . amphetamine-dextroamphetamine (ADDERALL) 10 MG tablet Take 10 mg by mouth daily with breakfast.    . escitalopram (LEXAPRO) 20 MG tablet Take 20 mg by mouth daily.    .  pantoprazole (PROTONIX) 40 MG tablet Take 40 mg by mouth daily.    . valACYclovir (VALTREX) 500 MG tablet Take 500 mg by mouth daily.    Marland Kitchen acetaminophen (TYLENOL) 500 MG tablet Take 500 mg by mouth every 6 (six) hours as needed for moderate pain.    . calcium carbonate (TUMS - DOSED IN MG ELEMENTAL CALCIUM) 500 MG chewable tablet Chew 1 tablet by mouth 3 (three) times daily as needed for indigestion or heartburn.    . docusate sodium (COLACE) 100 MG capsule Take 100 mg by mouth daily.    Marland Kitchen esomeprazole (NEXIUM) 40 MG capsule Take 40 mg by mouth daily.  1  . NIFEdipine (PROCARDIA) 10 MG capsule Take 10 mg by mouth QID.    Marland Kitchen Prenatal Vit w/Fe-Methylfol-FA (PNV PO) Take 1 tablet by mouth daily.     . progesterone (ENDOMETRIN) 100 MG vaginal insert Place 100 mg vaginally at bedtime.      No facility-administered medications prior to visit.    No Known Allergies  ROS Review of Systems  Constitutional: Negative.   HENT: Negative.   Eyes: Negative.   Respiratory: Negative.   Cardiovascular: Negative.   Gastrointestinal: Negative.   Endocrine: Negative.   Genitourinary: Positive for menstrual problem.       Hypermenorrhea some dyspeunia.    Musculoskeletal: Negative.   Skin: Negative.   Neurological: Negative.   Psychiatric/Behavioral: Negative.       Objective:    Physical Exam  Constitutional: She is oriented to person, place, and time. She appears well-developed and well-nourished.  HENT:  Head: Normocephalic and atraumatic.  Right Ear: External ear normal.  Left Ear: External ear normal.  Nose: Nose normal.  Mouth/Throat: Oropharynx is clear and moist.  Eyes: Pupils are equal, round, and reactive to light. Conjunctivae and EOM are normal.  Cardiovascular: Normal rate, regular rhythm, normal heart sounds and intact distal pulses.  Pulmonary/Chest: Effort normal and breath sounds normal.  Abdominal: Soft. Bowel sounds are normal.  Musculoskeletal:        General: Normal  range of motion.     Cervical back: Normal range of motion and neck supple.  Neurological: She is alert and oriented to person, place, and time. She has normal reflexes.  Skin: Skin is warm and dry.  Psychiatric: She has a normal mood and affect. Her behavior is normal.  Vitals reviewed.  Depression screen PHQ 2/9 07/02/2019  Decreased Interest 1  Down, Depressed, Hopeless 1  PHQ - 2 Score 2  Altered sleeping 2  Tired, decreased energy 2  Change in appetite 1  Feeling bad or failure about yourself  0  Trouble concentrating 2  Moving slowly or fidgety/restless 1  Suicidal thoughts 0  PHQ-9 Score 10   BP 100/86 (BP Location: Right Arm, Patient Position: Sitting)   Pulse 70   Temp 97.8 F (36.6 C) (Temporal)   Resp 17   Ht 5\' 3"  (1.6 m)   Wt 217 lb 3.2 oz (98.5 kg)   BMI 38.48 kg/m  Wt Readings from Last 3 Encounters:  07/02/19 217 lb 3.2 oz (98.5 kg)  11/07/16 240 lb (108.9 kg)  10/24/16 244 lb (110.7 kg)     Health Maintenance Due  Topic Date Due  . HIV Screening  Never done  . COVID-19 Vaccine (1) Never done  . TETANUS/TDAP  Never done  . PAP-Cervical Cytology Screening  Never done  . PAP SMEAR-Modifier  Never done    There are no preventive care reminders to display for this patient.  No results found for: TSH Lab Results  Component Value Date   WBC 13.6 (H) 10/24/2016   HGB 12.9 10/24/2016   HCT 36.9 10/24/2016   MCV 87.2 10/24/2016   PLT 254 10/24/2016   Lab Results  Component Value Date   NA 136 10/24/2016   K 4.4 10/24/2016   CO2 20 (L) 10/24/2016   GLUCOSE 195 (H) 10/24/2016   BUN 9 10/24/2016   CREATININE 0.63 10/24/2016   BILITOT 0.2 (L) 10/24/2016   ALKPHOS 128 (H) 10/24/2016   AST 19 10/24/2016   ALT 12 (L) 10/24/2016   PROT 6.4 (L) 10/24/2016   ALBUMIN 2.9 (L) 10/24/2016   CALCIUM 9.0 10/24/2016   ANIONGAP 11 10/24/2016   No results found for: CHOL No results found for: HDL No results found for: LDLCALC No results found for:  TRIG No results found for: CHOLHDL No results found for: HGBA1C    Assessment & Plan:   Problem List Items Addressed This Visit      Digestive   Gastroesophageal reflux disease without esophagitis    Plan of care was formulated today.  She is doing well.  A plan of care was formulated using patient exam, tests and other sources to optimize care using evidence based information.  Recommend no smoking, no eating after supper, avoid fatty foods, elevate Head of bed, avoid tight fitting clothing.  Continue on pantoprazole.      Relevant Medications   pantoprazole (PROTONIX) 40 MG tablet     Genitourinary   Herpesviral vulvovaginitis    She has a history of vulvar herpes but none recently, refill valtrex      Relevant Medications   valACYclovir (VALTREX) 500 MG tablet     Other   Generalized anxiety disorder - Primary    Patient has a combination of depression with some anxiety.  She is out of her medicine and is having mor depression      Relevant Medications   escitalopram (LEXAPRO) 20 MG tablet   Attention deficit hyperactivity disorder, combined type    Stable adult ADD with no abuse- refill adderall      Relevant Medications   amphetamine-dextroamphetamine (ADDERALL) 10 MG tablet   Hypermenorrhea    Patient has heavy periods lasting 8 days and 3 heavy days, she was anemic at last check when she had baby and no follow up.  Check CBC and start loestrin 1.5/30 recheck in 3 months      Relevant Medications   Norethindrone Acetate-Ethinyl Estradiol (LOESTRIN 1.5/30, 21,) 1.5-30 MG-MCG tablet   Other Relevant Orders   CBC with Differential/Platelet   Comprehensive metabolic panel    Other Visit Diagnoses    Migraine without  aura and without status migrainosus, not intractable       Relevant Medications   escitalopram (LEXAPRO) 20 MG tablet   Herpes       Relevant Medications   valACYclovir (VALTREX) 500 MG tablet      Meds ordered this encounter  Medications  .  valACYclovir (VALTREX) 500 MG tablet    Sig: Take 1 tablet (500 mg total) by mouth daily.    Dispense:  30 tablet    Refill:  6  . pantoprazole (PROTONIX) 40 MG tablet    Sig: Take 1 tablet (40 mg total) by mouth daily.    Dispense:  30 tablet    Refill:  6  . escitalopram (LEXAPRO) 20 MG tablet    Sig: Take 1 tablet (20 mg total) by mouth daily.    Dispense:  30 tablet    Refill:  6  . amphetamine-dextroamphetamine (ADDERALL) 10 MG tablet    Sig: Take 1 tablet (10 mg total) by mouth daily with breakfast.    Dispense:  30 tablet    Refill:  0  . Norethindrone Acetate-Ethinyl Estradiol (LOESTRIN 1.5/30, 21,) 1.5-30 MG-MCG tablet    Sig: Take 1 tablet by mouth daily.    Dispense:  1 Package    Refill:  11    Follow-up: Return in about 3 months (around 10/02/2019).    Brent Bulla, MD

## 2019-07-02 NOTE — Assessment & Plan Note (Signed)
Stable adult ADD with no abuse- refill adderall

## 2019-07-02 NOTE — Assessment & Plan Note (Signed)
Patient has a combination of depression with some anxiety.  She is out of her medicine and is having mor depression

## 2019-07-03 LAB — COMPREHENSIVE METABOLIC PANEL
ALT: 49 IU/L — ABNORMAL HIGH (ref 0–32)
AST: 26 IU/L (ref 0–40)
Albumin/Globulin Ratio: 1.5 (ref 1.2–2.2)
Albumin: 4.6 g/dL (ref 3.9–5.0)
Alkaline Phosphatase: 59 IU/L (ref 39–117)
BUN/Creatinine Ratio: 21 (ref 9–23)
BUN: 14 mg/dL (ref 6–20)
Bilirubin Total: 0.7 mg/dL (ref 0.0–1.2)
CO2: 24 mmol/L (ref 20–29)
Calcium: 10 mg/dL (ref 8.7–10.2)
Chloride: 104 mmol/L (ref 96–106)
Creatinine, Ser: 0.67 mg/dL (ref 0.57–1.00)
GFR calc Af Amer: 137 mL/min/{1.73_m2} (ref >59)
GFR calc non Af Amer: 119 mL/min/{1.73_m2} (ref >59)
Globulin, Total: 3 g/dL (ref 1.5–4.5)
Glucose: 86 mg/dL (ref 65–99)
Potassium: 3.9 mmol/L (ref 3.5–5.2)
Sodium: 144 mmol/L (ref 134–144)
Total Protein: 7.6 g/dL (ref 6.0–8.5)

## 2019-07-03 LAB — CBC WITH DIFFERENTIAL/PLATELET
Basophils Absolute: 0 10*3/uL (ref 0.0–0.2)
Basos: 0 %
EOS (ABSOLUTE): 0.2 10*3/uL (ref 0.0–0.4)
Eos: 1 %
Hematocrit: 43.8 % (ref 34.0–46.6)
Hemoglobin: 14.5 g/dL (ref 11.1–15.9)
Immature Grans (Abs): 0.1 10*3/uL (ref 0.0–0.1)
Immature Granulocytes: 1 %
Lymphocytes Absolute: 3.2 10*3/uL — ABNORMAL HIGH (ref 0.7–3.1)
Lymphs: 26 %
MCH: 28.1 pg (ref 26.6–33.0)
MCHC: 33.1 g/dL (ref 31.5–35.7)
MCV: 85 fL (ref 79–97)
Monocytes Absolute: 0.8 10*3/uL (ref 0.1–0.9)
Monocytes: 6 %
Neutrophils Absolute: 7.8 10*3/uL — ABNORMAL HIGH (ref 1.4–7.0)
Neutrophils: 66 %
Platelets: 326 10*3/uL (ref 150–450)
RBC: 5.16 x10E6/uL (ref 3.77–5.28)
RDW: 13.8 % (ref 11.7–15.4)
WBC: 12 10*3/uL — ABNORMAL HIGH (ref 3.4–10.8)

## 2019-07-03 NOTE — Progress Notes (Signed)
Wbc 12,000 down from last cbc, no anemia, kidney tests normal and one liver test up a little lp

## 2019-08-13 ENCOUNTER — Encounter: Payer: Self-pay | Admitting: Legal Medicine

## 2019-08-13 ENCOUNTER — Other Ambulatory Visit: Payer: Self-pay

## 2019-08-13 ENCOUNTER — Ambulatory Visit: Payer: Commercial Managed Care - PPO | Admitting: Legal Medicine

## 2019-08-13 VITALS — BP 118/78 | HR 56 | Temp 97.6°F | Resp 16 | Ht 63.0 in | Wt 215.0 lb

## 2019-08-13 DIAGNOSIS — H66002 Acute suppurative otitis media without spontaneous rupture of ear drum, left ear: Secondary | ICD-10-CM

## 2019-08-13 DIAGNOSIS — H6690 Otitis media, unspecified, unspecified ear: Secondary | ICD-10-CM | POA: Insufficient documentation

## 2019-08-13 MED ORDER — AMOXICILLIN-POT CLAVULANATE 875-125 MG PO TABS: 1.0000 | ORAL_TABLET | Freq: Two times a day (BID) | ORAL | 0 refills | Status: DC

## 2019-08-13 NOTE — Progress Notes (Signed)
Established Patient Office Visit  Subjective:  Patient ID: Anita Peters, female    DOB: February 26, 1991  Age: 29 y.o. MRN: [id number]  CC:  Chief Complaint  Patient presents with  . Ear Pain    Left    HPI Anita Peters presents for left ear pain and decreased hearing.  She used debrox and got some out. She still has problems with ear.  Past Medical History:  Diagnosis Date  . Antepartum mild preeclampsia 11/14/2016   Formatting of this note might be different from the original. Seen for repeat BP check in triage 12/04/16 Rescue BMZ 10/8 - 10/9  HELLP labs normal/stable 12/16/16, except urine P/C 10.7 Dr. Alfonse Spruce spoke with Dr. Renella Cunas (MFM) on 12/18/16 regarding patients clinical care with severe BP on 10/19 (though not 4h apart) and worsening proteinuria with urine P/C of 10.7 (previously 0.3 in August).   . Migraine without aura and without status migrainosus, not intractable 07/02/2019  . Pregnancy induced hypertension   . Rh negative state in antepartum period 06/09/2016  . Short cervix affecting pregnancy 09/26/2016    Past Surgical History:  Procedure Laterality Date  . CESAREAN SECTION  12/20/2016  . DILATION AND CURETTAGE OF UTERUS    . WISDOM TOOTH EXTRACTION      Family History  Problem Relation Age of Onset  . Diabetes Mother   . Diabetes Maternal Uncle   . Diabetes Maternal Grandmother     Social History   Socioeconomic History  . Marital status: Single    Spouse name: Not on file  . Number of children: 2  . Years of education: Not on file  . Highest education level: Not on file  Occupational History  . Occupation: CNA  Tobacco Use  . Smoking status: Never Smoker  . Smokeless tobacco: Never Used  Substance and Sexual Activity  . Alcohol use: Yes    Alcohol/week: 2.0 standard drinks    Types: 2 Shots of liquor per week    Comment: Every 6 months  . Drug use: No  . Sexual activity: Not Currently    Birth control/protection: None  Other Topics Concern    . Not on file  Social History Narrative  . Not on file   Social Determinants of Health   Financial Resource Strain:   . Difficulty of Paying Living Expenses:   Food Insecurity:   . Worried About Charity fundraiser in the Last Year:   . Arboriculturist in the Last Year:   Transportation Needs:   . Film/video editor (Medical):   Marland Kitchen Lack of Transportation (Non-Medical):   Physical Activity:   . Days of Exercise per Week:   . Minutes of Exercise per Session:   Stress:   . Feeling of Stress :   Social Connections:   . Frequency of Communication with Friends and Family:   . Frequency of Social Gatherings with Friends and Family:   . Attends Religious Services:   . Active Member of Clubs or Organizations:   . Attends Archivist Meetings:   Marland Kitchen Marital Status:   Intimate Partner Violence:   . Fear of Current or Ex-Partner:   . Emotionally Abused:   Marland Kitchen Physically Abused:   . Sexually Abused:     Outpatient Medications Prior to Visit  Medication Sig Dispense Refill  . amphetamine-dextroamphetamine (ADDERALL) 10 MG tablet Take 1 tablet (10 mg total) by mouth daily with breakfast. 30 tablet 0  . escitalopram (LEXAPRO) 20 MG  tablet Take 1 tablet (20 mg total) by mouth daily. 30 tablet 6  . pantoprazole (PROTONIX) 40 MG tablet Take 1 tablet (40 mg total) by mouth daily. 30 tablet 6  . valACYclovir (VALTREX) 500 MG tablet Take 1 tablet (500 mg total) by mouth daily. 30 tablet 6  . Norethindrone Acetate-Ethinyl Estradiol (LOESTRIN 1.5/30, 21,) 1.5-30 MG-MCG tablet Take 1 tablet by mouth daily. 1 Package 11  . JUNEL FE 1.5/30 1.5-30 MG-MCG tablet Take 1 tablet by mouth daily.     No facility-administered medications prior to visit.    No Known Allergies  ROS Review of Systems  Constitutional: Negative.   HENT: Negative.   Eyes: Negative.   Respiratory: Negative.   Cardiovascular: Negative.   Gastrointestinal: Negative.   Endocrine: Negative.   Genitourinary:  Negative.   Musculoskeletal: Negative.   Skin: Negative.   Neurological: Negative.       Objective:    Physical Exam Vitals reviewed.  HENT:     Right Ear: Tympanic membrane and ear canal normal.     Left Ear: There is impacted cerumen.  Cardiovascular:     Rate and Rhythm: Normal rate and regular rhythm.     Pulses: Normal pulses.     Heart sounds: Normal heart sounds.  Pulmonary:     Effort: Pulmonary effort is normal.     Breath sounds: Normal breath sounds.     BP 118/78   Pulse (!) 56   Temp 97.6 F (36.4 C)   Resp 16   Ht 5\' 3"  (1.6 m)   Wt 215 lb (97.5 kg)   SpO2 98%   BMI 38.09 kg/m  Wt Readings from Last 3 Encounters:  08/13/19 215 lb (97.5 kg)  07/02/19 217 lb 3.2 oz (98.5 kg)  11/07/16 240 lb (108.9 kg)     Health Maintenance Due  Topic Date Due  . Hepatitis C Screening  Never done  . COVID-19 Vaccine (1) Never done  . HIV Screening  Never done  . TETANUS/TDAP  Never done  . PAP-Cervical Cytology Screening  Never done  . PAP SMEAR-Modifier  Never done    There are no preventive care reminders to display for this patient.  No results found for: TSH Lab Results  Component Value Date   WBC 12.0 (H) 07/02/2019   HGB 14.5 07/02/2019   HCT 43.8 07/02/2019   MCV 85 07/02/2019   PLT 326 07/02/2019   Lab Results  Component Value Date   NA 144 07/02/2019   K 3.9 07/02/2019   CO2 24 07/02/2019   GLUCOSE 86 07/02/2019   BUN 14 07/02/2019   CREATININE 0.67 07/02/2019   BILITOT 0.7 07/02/2019   ALKPHOS 59 07/02/2019   AST 26 07/02/2019   ALT 49 (H) 07/02/2019   PROT 7.6 07/02/2019   ALBUMIN 4.6 07/02/2019   CALCIUM 10.0 07/02/2019   ANIONGAP 11 10/24/2016   No results found for: CHOL No results found for: HDL No results found for: LDLCALC No results found for: TRIG No results found for: CHOLHDL No results found for: 10/26/2016    Assessment & Plan:   Problem List Items Addressed This Visit      Nervous and Auditory   Acute otitis  media - Primary    Patient's cerumen was lavaged clear and left ear was red and bulging.  Treat OM with augmentin.        Relevant Medications   amoxicillin-clavulanate (AUGMENTIN) 875-125 MG tablet      Meds ordered  this encounter  Medications  . amoxicillin-clavulanate (AUGMENTIN) 875-125 MG tablet    Sig: Take 1 tablet by mouth 2 (two) times daily.    Dispense:  20 tablet    Refill:  0    Follow-up: Return if symptoms worsen or fail to improve.    Brent Bulla, MD

## 2019-08-13 NOTE — Assessment & Plan Note (Signed)
Patient's cerumen was lavaged clear and left ear was red and bulging.  Treat OM with augmentin.

## 2019-09-18 ENCOUNTER — Other Ambulatory Visit: Payer: Self-pay

## 2019-09-18 DIAGNOSIS — F902 Attention-deficit hyperactivity disorder, combined type: Secondary | ICD-10-CM

## 2019-09-18 MED ORDER — AMPHETAMINE-DEXTROAMPHETAMINE 10 MG PO TABS: 10.0000 mg | ORAL_TABLET | Freq: Every day | ORAL | 0 refills | Status: DC

## 2019-10-02 ENCOUNTER — Ambulatory Visit: Payer: Commercial Managed Care - PPO | Admitting: Legal Medicine

## 2019-11-27 ENCOUNTER — Telehealth (INDEPENDENT_AMBULATORY_CARE_PROVIDER_SITE_OTHER): Payer: Commercial Managed Care - PPO | Admitting: Family Medicine

## 2019-11-27 ENCOUNTER — Encounter: Payer: Self-pay | Admitting: Family Medicine

## 2019-11-27 VITALS — BP 122/72 | HR 86 | Temp 98.0°F | Ht 63.0 in | Wt 212.0 lb

## 2019-11-27 DIAGNOSIS — Z20822 Contact with and (suspected) exposure to covid-19: Secondary | ICD-10-CM | POA: Diagnosis not present

## 2019-11-27 DIAGNOSIS — R05 Cough: Secondary | ICD-10-CM | POA: Diagnosis not present

## 2019-11-27 DIAGNOSIS — R059 Cough, unspecified: Secondary | ICD-10-CM | POA: Insufficient documentation

## 2019-11-27 LAB — POC COVID19 BINAXNOW: SARS Coronavirus 2 Ag: NEGATIVE

## 2019-11-27 MED ORDER — AZITHROMYCIN 250 MG PO TABS: ORAL_TABLET | ORAL | 0 refills | Status: DC

## 2019-11-27 NOTE — Progress Notes (Signed)
Acute Office Visit  Subjective:    Patient ID: Anita Peters, female    DOB: August 19, 1990, 29 y.o.   MRN: [id number]    Virtual Visit via Telephone Note  I connected with Benay Pike on 11/27/19 at  9:30 AM EDT by telephone and verified that I am speaking with the correct person using two identifiers.DOB/address  Location: Patient: home Provider: [facility]   I discussed the limitations, risks, security and privacy concerns of performing an evaluation and management service by telephone and the availability of in person appointments. I also discussed with the patient that there may be a patient responsible charge related to this service. The patient expressed understanding and agreed to proceed.   Chief Complaint  Patient presents with  . Cough    Since 3 days ago and it is getting worse.  . Back Pain    Upper left back pain.    HPI Pt with concern  for cough-tightness  Crackles in the left and right clear Left side is "tight" Taking mucinex-max fast-cold/flu-tylenol Onset 2 days ago-tightness-left shoulder/back-mucinex helps break up -no radiation Past Medical History:  Diagnosis Date  . Antepartum mild preeclampsia 11/14/2016   Formatting of this note might be different from the original. Seen for repeat BP check in triage 12/04/16 Rescue BMZ 10/8 - 10/9  HELLP labs normal/stable 12/16/16, except urine P/C 10.7 Dr. Cyndie Chime spoke with Dr. Particia Nearing (MFM) on 12/18/16 regarding patients clinical care with severe BP on 10/19 (though not 4h apart) and worsening proteinuria with urine P/C of 10.7 (previously 0.3 in August).   . Migraine without aura and without status migrainosus, not intractable 07/02/2019  . Pregnancy induced hypertension   . Rh negative state in antepartum period 06/09/2016  . Short cervix affecting pregnancy 09/26/2016    Past Surgical History:  Procedure Laterality Date  . CESAREAN SECTION  12/20/2016  . DILATION AND CURETTAGE OF UTERUS    . WISDOM  TOOTH EXTRACTION      Family History  Problem Relation Age of Onset  . Diabetes Mother   . Diabetes Maternal Uncle   . Diabetes Maternal Grandmother     Social History   Socioeconomic History  . Marital status: Single    Spouse name: Not on file  . Number of children: 2  . Years of education: Not on file  . Highest education level: Not on file  Occupational History  . Occupation: CNA  Tobacco Use  . Smoking status: Never Smoker  . Smokeless tobacco: Never Used  Substance and Sexual Activity  . Alcohol use: Yes    Alcohol/week: 2.0 standard drinks    Types: 2 Shots of liquor per week    Comment: Every 6 months  . Drug use: No  . Sexual activity: Not Currently    Birth control/protection: None  Other Topics Concern  . Not on file  Social History Narrative  . Not on file   Social Determinants of Health   Financial Resource Strain:   . Difficulty of Paying Living Expenses: Not on file  Food Insecurity:   . Worried About Programme researcher, broadcasting/film/video in the Last Year: Not on file  . Ran Out of Food in the Last Year: Not on file  Transportation Needs:   . Lack of Transportation (Medical): Not on file  . Lack of Transportation (Non-Medical): Not on file  Physical Activity:   . Days of Exercise per Week: Not on file  . Minutes of Exercise per Session:  Not on file  Stress:   . Feeling of Stress : Not on file  Social Connections:   . Frequency of Communication with Friends and Family: Not on file  . Frequency of Social Gatherings with Friends and Family: Not on file  . Attends Religious Services: Not on file  . Active Member of Clubs or Organizations: Not on file  . Attends Banker Meetings: Not on file  . Marital Status: Not on file  Intimate Partner Violence:   . Fear of Current or Ex-Partner: Not on file  . Emotionally Abused: Not on file  . Physically Abused: Not on file  . Sexually Abused: Not on file    Outpatient Medications Prior to Visit  Medication  Sig Dispense Refill  . amphetamine-dextroamphetamine (ADDERALL) 10 MG tablet Take 1 tablet (10 mg total) by mouth daily with breakfast. 30 tablet 0  . escitalopram (LEXAPRO) 20 MG tablet Take 1 tablet (20 mg total) by mouth daily. 30 tablet 6  . JUNEL FE 1.5/30 1.5-30 MG-MCG tablet Take 1 tablet by mouth daily.    . pantoprazole (PROTONIX) 40 MG tablet Take 1 tablet (40 mg total) by mouth daily. 30 tablet 6  . valACYclovir (VALTREX) 500 MG tablet Take 1 tablet (500 mg total) by mouth daily. 30 tablet 6  . amoxicillin-clavulanate (AUGMENTIN) 875-125 MG tablet Take 1 tablet by mouth 2 (two) times daily. 20 tablet 0   No facility-administered medications prior to visit.    No Known Allergies  Review of Systems  Constitutional: Negative for fatigue and fever.  HENT: Positive for sore throat. Negative for congestion, facial swelling, sinus pressure and trouble swallowing.   Respiratory: Positive for cough and chest tightness. Negative for shortness of breath and wheezing.   Cardiovascular: Negative for chest pain.  Gastrointestinal: Positive for nausea. Negative for diarrhea.  Neurological: Negative for dizziness and headaches.   Physical Exam Temp-not taken at home, oxygen 96, pulse 68 Ht 5\' 3"  (1.6 m)   Wt 212 lb (96.2 kg)   BMI 37.55 kg/m  Wt Readings from Last 3 Encounters:  11/27/19 212 lb (96.2 kg)  08/13/19 215 lb (97.5 kg)  07/02/19 217 lb 3.2 oz (98.5 kg)    Health Maintenance Due  Topic Date Due  . Hepatitis C Screening  Never done  . COVID-19 Vaccine (1) Never done  . HIV Screening  Never done  . TETANUS/TDAP  Never done  . PAP-Cervical Cytology Screening  Never done  . PAP SMEAR-Modifier  Never done  . INFLUENZA VACCINE  Never done     Lab Results  Component Value Date   WBC 12.0 (H) 07/02/2019   HGB 14.5 07/02/2019   HCT 43.8 07/02/2019   MCV 85 07/02/2019   PLT 326 07/02/2019   Lab Results  Component Value Date   NA 144 07/02/2019   K 3.9 07/02/2019    CO2 24 07/02/2019   GLUCOSE 86 07/02/2019   BUN 14 07/02/2019   CREATININE 0.67 07/02/2019   BILITOT 0.7 07/02/2019   ALKPHOS 59 07/02/2019   AST 26 07/02/2019   ALT 49 (H) 07/02/2019   PROT 7.6 07/02/2019   ALBUMIN 4.6 07/02/2019   CALCIUM 10.0 07/02/2019   ANIONGAP 11 10/24/2016         Assessment and Plan:  1. Cough - POC COVID-19 - DG Chest 2 View; Future Follow Up Instructions: cxr    I discussed the assessment and treatment plan with the patient. The patient was provided an opportunity  to ask questions and all were answered. The patient agreed with the plan and demonstrated an understanding of the instructions.   The patient was advised to call back or seek an in-person evaluation if the symptoms worsen or if the condition fails to improve as anticipated.  I provided 9 minutes of non-face-to-face time during this encounter.   Khyri Hinzman Mat Carne, MD   1. Cough zpack-rx mucinex DM-otc - POC COVID-19 - DG Chest 2 View; Future  cxr negative Wylder Macomber Mat Carne, MD

## 2019-11-27 NOTE — Patient Instructions (Addendum)
mucinex DM z-pack Fluids cxr if no improvement

## 2019-11-28 ENCOUNTER — Encounter: Payer: Self-pay | Admitting: Family Medicine

## 2020-01-15 ENCOUNTER — Other Ambulatory Visit: Payer: Self-pay

## 2020-01-15 DIAGNOSIS — F902 Attention-deficit hyperactivity disorder, combined type: Secondary | ICD-10-CM

## 2020-01-15 MED ORDER — AMPHETAMINE-DEXTROAMPHETAMINE 10 MG PO TABS: 10.0000 mg | ORAL_TABLET | Freq: Every day | ORAL | 0 refills | Status: DC

## 2020-03-25 ENCOUNTER — Telehealth (INDEPENDENT_AMBULATORY_CARE_PROVIDER_SITE_OTHER): Payer: Commercial Managed Care - PPO | Admitting: Legal Medicine

## 2020-03-25 ENCOUNTER — Encounter: Payer: Self-pay | Admitting: Legal Medicine

## 2020-03-25 VITALS — Ht 63.0 in | Wt 210.0 lb

## 2020-03-25 DIAGNOSIS — Z20822 Contact with and (suspected) exposure to covid-19: Secondary | ICD-10-CM

## 2020-03-25 LAB — POC COVID19 BINAXNOW: SARS Coronavirus 2 Ag: NEGATIVE

## 2020-03-25 NOTE — Progress Notes (Signed)
Negative covid lp

## 2020-03-25 NOTE — Progress Notes (Signed)
Virtual Visit via Telephone Note   This visit type was conducted due to national recommendations for restrictions regarding the COVID-19 Pandemic (e.g. social distancing) in an effort to limit this patient's exposure and mitigate transmission in our community.  Due to her co-morbid illnesses, this patient is at least at moderate risk for complications without adequate follow up.  This format is felt to be most appropriate for this patient at this time.  The patient did not have access to video technology/had technical difficulties with video requiring transitioning to audio format only (telephone).  All issues noted in this document were discussed and addressed.  No physical exam could be performed with this format.  Patient verbally consented to a telehealth visit.   Date:  03/25/2020   ID:  Anita Peters, DOB [birth year], MRN [id number]  Patient Location: Home Provider Location: Office/Clinic  PCP:  Abigail Miyamoto, MD   Evaluation Performed:  New Patient Evaluation  Chief Complaint:  Sick, exposed to covid, Monday sick  History of Present Illness:    Anita Peters is a 30 y.o. female with Sick, exposed to covid, Monday sick, no smell or taste,   The patient does have symptoms concerning for COVID-19 infection (fever, chills, cough, or new shortness of breath).    Past Medical History:  Diagnosis Date  . Antepartum mild preeclampsia 11/14/2016   Formatting of this note might be different from the original. Seen for repeat BP check in triage 12/04/16 Rescue BMZ 10/8 - 10/9  HELLP labs normal/stable 12/16/16, except urine P/C 10.7 Dr. Cyndie Chime spoke with Dr. Particia Nearing (MFM) on 12/18/16 regarding patients clinical care with severe BP on 10/19 (though not 4h apart) and worsening proteinuria with urine P/C of 10.7 (previously 0.3 in August).   . Migraine without aura and without status migrainosus, not intractable 07/02/2019  . Pregnancy induced hypertension   . Rh negative state in  antepartum period 06/09/2016  . Short cervix affecting pregnancy 09/26/2016    Past Surgical History:  Procedure Laterality Date  . CESAREAN SECTION  12/20/2016  . DILATION AND CURETTAGE OF UTERUS    . WISDOM TOOTH EXTRACTION      Family History  Problem Relation Age of Onset  . Diabetes Mother   . Diabetes Maternal Uncle   . Diabetes Maternal Grandmother     Social History   Socioeconomic History  . Marital status: Single    Spouse name: Not on file  . Number of children: 2  . Years of education: Not on file  . Highest education level: Not on file  Occupational History  . Occupation: CNA  Tobacco Use  . Smoking status: Never Smoker  . Smokeless tobacco: Never Used  Substance and Sexual Activity  . Alcohol use: Yes    Alcohol/week: 2.0 standard drinks    Types: 2 Shots of liquor per week    Comment: Every 6 months  . Drug use: No  . Sexual activity: Not Currently    Birth control/protection: None  Other Topics Concern  . Not on file  Social History Narrative  . Not on file   Social Determinants of Health   Financial Resource Strain: Not on file  Food Insecurity: Not on file  Transportation Needs: Not on file  Physical Activity: Not on file  Stress: Not on file  Social Connections: Not on file  Intimate Partner Violence: Not on file    Outpatient Medications Prior to Visit  Medication Sig Dispense Refill  . amphetamine-dextroamphetamine (ADDERALL) 10  MG tablet Take 1 tablet (10 mg total) by mouth daily with breakfast. 30 tablet 0  . azithromycin (ZITHROMAX) 250 MG tablet Take 2 po today then 1 po day 2-5 6 tablet 0  . escitalopram (LEXAPRO) 20 MG tablet Take 1 tablet (20 mg total) by mouth daily. 30 tablet 6  . JUNEL FE 1.5/30 1.5-30 MG-MCG tablet Take 1 tablet by mouth daily.    . pantoprazole (PROTONIX) 40 MG tablet Take 1 tablet (40 mg total) by mouth daily. 30 tablet 6  . valACYclovir (VALTREX) 500 MG tablet Take 1 tablet (500 mg total) by mouth daily.  30 tablet 6   No facility-administered medications prior to visit.    Allergies:   Patient has no known allergies.   Social History   Tobacco Use  . Smoking status: Never Smoker  . Smokeless tobacco: Never Used  Substance Use Topics  . Alcohol use: Yes    Alcohol/week: 2.0 standard drinks    Types: 2 Shots of liquor per week    Comment: Every 6 months  . Drug use: No     Review of Systems  Constitutional: Negative for fever and malaise/fatigue.  HENT: Positive for congestion and sore throat.   Eyes: Negative for redness.  Respiratory: Positive for cough.   Cardiovascular: Negative for chest pain and orthopnea.  Gastrointestinal: Negative.   Genitourinary: Negative for dysuria.  Musculoskeletal: Negative for myalgias.  Neurological: Positive for headaches.  Psychiatric/Behavioral: Negative.      Labs/Other Tests and Data Reviewed:    Recent Labs: 07/02/2019: ALT 49; BUN 14; Creatinine, Ser 0.67; Hemoglobin 14.5; Platelets 326; Potassium 3.9; Sodium 144   Recent Lipid Panel No results found for: CHOL, TRIG, HDL, CHOLHDL, LDLCALC, LDLDIRECT  Wt Readings from Last 3 Encounters:  03/25/20 210 lb (95.3 kg)  11/27/19 212 lb (96.2 kg)  08/13/19 215 lb (97.5 kg)     Objective:    Vital Signs:  Ht 5\' 3"  (1.6 m)   Wt 210 lb (95.3 kg)   BMI 37.20 kg/m    Physical Exam reviewed  ASSESSMENT & PLAN:   Diagnoses and all orders for this visit: Close exposure to COVID-19 virus -     POC COVID-19 -     Novel Coronavirus, NAA ([facility]) Covid rapid test negative       COVID-19 Education: The signs and symptoms of COVID-19 were discussed with the patient and how to seek care for testing (follow up with PCP or arrange E-visit). The importance of social distancing was discussed today.   I spent 15 minutes dedicated to the care of this patient on the date of this encounter include face-to-face time with the patient, as well as:   Follow Up:  In Person  prn  Signed,  , MD  03/25/2020 11:47 AM    [facility]

## 2020-03-26 LAB — SARS-COV-2, NAA 2 DAY TAT

## 2020-03-26 LAB — NOVEL CORONAVIRUS, NAA: SARS-CoV-2, NAA: NOT DETECTED

## 2020-03-28 NOTE — Progress Notes (Signed)
Covid tests negative lp

## 2020-04-02 ENCOUNTER — Other Ambulatory Visit: Payer: Self-pay | Admitting: Legal Medicine

## 2020-04-02 DIAGNOSIS — F902 Attention-deficit hyperactivity disorder, combined type: Secondary | ICD-10-CM

## 2020-06-25 ENCOUNTER — Other Ambulatory Visit: Payer: Self-pay

## 2020-06-25 DIAGNOSIS — F902 Attention-deficit hyperactivity disorder, combined type: Secondary | ICD-10-CM

## 2020-06-25 MED ORDER — AMPHETAMINE-DEXTROAMPHETAMINE 10 MG PO TABS: 10.0000 mg | ORAL_TABLET | Freq: Every day | ORAL | 0 refills | Status: DC

## 2020-07-08 ENCOUNTER — Other Ambulatory Visit: Payer: Self-pay | Admitting: Legal Medicine

## 2020-07-08 DIAGNOSIS — F411 Generalized anxiety disorder: Secondary | ICD-10-CM

## 2020-07-28 ENCOUNTER — Encounter: Payer: Self-pay | Admitting: Legal Medicine

## 2020-07-28 ENCOUNTER — Other Ambulatory Visit: Payer: Self-pay

## 2020-07-28 ENCOUNTER — Ambulatory Visit: Payer: Commercial Managed Care - PPO | Admitting: Legal Medicine

## 2020-07-28 DIAGNOSIS — T07XXXA Unspecified multiple injuries, initial encounter: Secondary | ICD-10-CM | POA: Diagnosis not present

## 2020-07-28 DIAGNOSIS — S93419A Sprain of calcaneofibular ligament of unspecified ankle, initial encounter: Secondary | ICD-10-CM | POA: Diagnosis not present

## 2020-07-28 MED ORDER — ANKLE BRACE DELUXE LACED/L-XL MISC
1.0000 | Freq: Every day | 0 refills | Status: DC
Start: 2020-07-28 — End: 2021-05-17

## 2020-07-28 NOTE — Patient Instructions (Signed)
Ankle Sprain  An ankle sprain is a stretch or tear in one of the tough tissues (ligaments) that connect the bones in your ankle. An ankle sprain can happen when the ankle rolls outward (inversion sprain) or inward (eversion sprain). What are the causes? This condition is caused by rolling or twisting the ankle. What increases the risk? You are more likely to develop this condition if you play sports. What are the signs or symptoms? Symptoms of this condition include:  Pain in your ankle.  Swelling.  Bruising. This may happen right after you sprain your ankle or 1-2 days later.  Trouble standing or walking. How is this diagnosed? This condition is diagnosed with:  A physical exam. During the exam, your doctor will press on certain parts of your foot and ankle and try to move them in certain ways.  X-ray imaging. These may be taken to see how bad the sprain is and to check for broken bones. How is this treated? This condition may be treated with:  A brace or splint. This is used to keep the ankle from moving until it heals.  An elastic bandage. This is used to support the ankle.  Crutches.  Pain medicine.  Surgery. This may be needed if the sprain is very bad.  Physical therapy. This may help to improve movement in the ankle. Follow these instructions at home: If you have a brace or a splint:  Wear the brace or splint as told by your doctor. Remove it only as told by your doctor.  Loosen the brace or splint if your toes: ? Tingle. ? Lose feeling (become numb). ? Turn cold and blue.  Keep the brace or splint clean.  If the brace or splint is not waterproof: ? Do not let it get wet. ? Cover it with a watertight covering when you take a bath or a shower. If you have an elastic bandage (dressing):  Remove it to shower or bathe.  Try not to move your ankle much, but wiggle your toes from time to time. This helps to prevent swelling.  Adjust the dressing if it feels  too tight.  Loosen the dressing if your foot: ? Loses feeling. ? Tingles. ? Becomes cold and blue. Managing pain, stiffness, and swelling  Take over-the-counter and prescription medicines only as told by doctor.  For 2-3 days, keep your ankle raised (elevated) above the level of your heart.  If told, put ice on the injured area: ? If you have a removable brace or splint, remove it as told by your doctor. ? Put ice in a plastic bag. ? Place a towel between your skin and the bag. ? Leave the ice on for 20 minutes, 2-3 times a day.   General instructions  Rest your ankle.  Do not use your injured leg to support your body weight until your doctor says that you can. Use crutches as told by your doctor.  Do not use any products that contain nicotine or tobacco, such as cigarettes, e-cigarettes, and chewing tobacco. If you need help quitting, ask your doctor.  Keep all follow-up visits as told by your doctor. Contact a doctor if:  Your bruises or swelling are quickly getting worse.  Your pain does not get better after you take medicine. Get help right away if:  You cannot feel your toes or foot.  Your foot or toes look blue.  You have very bad pain that gets worse. Summary  An ankle sprain is a   stretch or tear in one of the tough tissues (ligaments) that connect the bones in your ankle.  This condition is caused by rolling or twisting the ankle.  Symptoms include pain, swelling, bruising, and trouble walking.  To help with pain and swelling, put ice on the injured ankle, raise your ankle above the level of your heart, and use an elastic bandage. Also, rest as told by your doctor.  Keep all follow-up visits as told by your doctor. This is important. This information is not intended to replace advice given to you by your health care provider. Make sure you discuss any questions you have with your health care provider. Document Revised: 07/10/2017 Document Reviewed:  07/10/2017 Elsevier Patient Education  2021 [employer]vier Inc.  

## 2020-07-28 NOTE — Progress Notes (Signed)
Subjective:  Patient ID: Anita Peters, female    DOB: 02-01-1991  Age: 30 y.o. MRN: [id number]  Chief Complaint  Patient presents with  . Ankle Pain    Right ankle pain since yesterday after she fell and hurt her left knee and left ankle.    HPI: patient was getting out of water at her house and turned her right ankle with inversion injury.  He right ankle is swollen and painful. He has abrasions left knee and foot.  Trouble walking. DOI 07/27/2020   Current Outpatient Medications on File Prior to Visit  Medication Sig Dispense Refill  . amphetamine-dextroamphetamine (ADDERALL) 10 MG tablet Take 1 tablet (10 mg total) by mouth daily with breakfast. 30 tablet 0  . escitalopram (LEXAPRO) 20 MG tablet Take 1 tablet (20 mg total) by mouth daily. 30 tablet 6  . JUNEL FE 1.5/30 1.5-30 MG-MCG tablet Take 1 tablet by mouth daily.    . pantoprazole (PROTONIX) 40 MG tablet Take 1 tablet (40 mg total) by mouth daily. 30 tablet 6  . valACYclovir (VALTREX) 500 MG tablet Take 1 tablet (500 mg total) by mouth daily. 30 tablet 6   No current facility-administered medications on file prior to visit.   Past Medical History:  Diagnosis Date  . Antepartum mild preeclampsia 11/14/2016   Formatting of this note might be different from the original. Seen for repeat BP check in triage 12/04/16 Rescue BMZ 10/8 - 10/9  HELLP labs normal/stable 12/16/16, except urine P/C 10.7 Dr. Cyndie Chime spoke with Dr. Particia Nearing (MFM) on 12/18/16 regarding patients clinical care with severe BP on 10/19 (though not 4h apart) and worsening proteinuria with urine P/C of 10.7 (previously 0.3 in August).   . Migraine without aura and without status migrainosus, not intractable 07/02/2019  . Pregnancy induced hypertension   . Rh negative state in antepartum period 06/09/2016  . Short cervix affecting pregnancy 09/26/2016   Past Surgical History:  Procedure Laterality Date  . CESAREAN SECTION  12/20/2016  . DILATION AND CURETTAGE OF  UTERUS    . WISDOM TOOTH EXTRACTION      Family History  Problem Relation Age of Onset  . Diabetes Mother   . Diabetes Maternal Uncle   . Diabetes Maternal Grandmother    Social History   Socioeconomic History  . Marital status: Single    Spouse name: Not on file  . Number of children: 2  . Years of education: Not on file  . Highest education level: Not on file  Occupational History  . Occupation: CNA  Tobacco Use  . Smoking status: Never Smoker  . Smokeless tobacco: Never Used  Substance and Sexual Activity  . Alcohol use: Yes    Alcohol/week: 2.0 standard drinks    Types: 2 Shots of liquor per week    Comment: Every 6 months  . Drug use: No  . Sexual activity: Not Currently    Birth control/protection: None  Other Topics Concern  . Not on file  Social History Narrative  . Not on file   Social Determinants of Health   Financial Resource Strain: Not on file  Food Insecurity: Not on file  Transportation Needs: Not on file  Physical Activity: Not on file  Stress: Not on file  Social Connections: Not on file    Review of Systems  Constitutional: Negative for activity change and appetite change.  HENT: Negative for congestion and rhinorrhea.   Eyes: Negative for visual disturbance.  Respiratory: Negative for chest tightness  and shortness of breath.   Cardiovascular: Negative for chest pain, palpitations and leg swelling.  Gastrointestinal: Negative for abdominal distention and abdominal pain.  Genitourinary: Negative for difficulty urinating and dyspareunia.  Musculoskeletal: Positive for joint swelling (right ankle).  Skin: Positive for rash.  Neurological: Negative.   Psychiatric/Behavioral: Negative.      Objective:  BP 118/82   Pulse 82   Temp (!) 97.4 F (36.3 C)   Resp 16   Ht 5\' 3"  (1.6 m)   Wt 222 lb (100.7 kg)   SpO2 98%   BMI 39.33 kg/m   BP/Weight 07/28/2020 03/25/2020 11/27/2019  Systolic BP 118 - 122  Diastolic BP 82 - 72  Wt. (Lbs) 222  210 212  BMI 39.33 37.2 37.55    Physical Exam Vitals reviewed.  Constitutional:      Appearance: Normal appearance. She is obese.  Cardiovascular:     Rate and Rhythm: Normal rate and regular rhythm.     Pulses: Normal pulses.     Heart sounds: Normal heart sounds. No murmur heard. No gallop.   Pulmonary:     Effort: Pulmonary effort is normal. No respiratory distress.     Breath sounds: Normal breath sounds. No rales.  Musculoskeletal:     Right ankle: Swelling present. Tenderness present over the lateral malleolus. Decreased range of motion. Anterior drawer test negative. Normal pulse.     Right Achilles Tendon: Normal.     Left ankle: Normal.     Left Achilles Tendon: Normal.       Legs:     Comments: Abrasions left LE  Neurological:     Mental Status: She is alert.       Lab Results  Component Value Date   WBC 12.0 (H) 07/02/2019   HGB 14.5 07/02/2019   HCT 43.8 07/02/2019   PLT 326 07/02/2019   GLUCOSE 86 07/02/2019   ALT 49 (H) 07/02/2019   AST 26 07/02/2019   NA 144 07/02/2019   K 3.9 07/02/2019   CL 104 07/02/2019   CREATININE 0.67 07/02/2019   BUN 14 07/02/2019   CO2 24 07/02/2019      Assessment & Plan:   Diagnoses and all orders for this visit: Sprain of calcaneofibular ligament of ankle, initial encounter -     Elastic Bandages & Supports (ANKLE BRACE DELUXE LACED/L-XL) MISC; 1 each by Does not apply route daily. -     DG Ankle Complete Right -     DME Crutches Patient has's sprain to the anterior talofibular ligament of the right ankle is swollen there she has some minimal pain of the deltoid ligament medially.  She has pain on range of motion but has full range of motion and negative drawer sign.  No ecchymosis.  Patient is to get an x-ray and to wear a lace up type brace with ice and use crutches for the next week and note was given for work.  Abrasions of multiple sites Patient has multiple abrasions on her left knee and dorsum of the  ankle.  These were cleaned and dressed        Follow-up: Return in about 1 week (around 08/04/2020) for ankle.  An After Visit Summary was printed and given to the patient.  10/04/2020, MD [facility] [phone]

## 2020-08-10 ENCOUNTER — Ambulatory Visit: Payer: Commercial Managed Care - PPO | Admitting: Legal Medicine

## 2020-08-10 ENCOUNTER — Other Ambulatory Visit: Payer: Self-pay

## 2020-08-10 ENCOUNTER — Encounter: Payer: Self-pay | Admitting: Legal Medicine

## 2020-08-10 DIAGNOSIS — S93491D Sprain of other ligament of right ankle, subsequent encounter: Secondary | ICD-10-CM | POA: Diagnosis not present

## 2020-08-10 DIAGNOSIS — S93491A Sprain of other ligament of right ankle, initial encounter: Secondary | ICD-10-CM | POA: Insufficient documentation

## 2020-08-10 NOTE — Progress Notes (Signed)
Established Patient Office Visit  Subjective:  Patient ID: Anita Peters, female    DOB: Aug 21, 1990  Age: 30 y.o. MRN: [id number]  CC:  Chief Complaint  Patient presents with   Ankle Pain    Right ankle pain is getting better.    HPI Anita Peters presents for right ankle sprain.  She is able to walk on it now.  Still some swelling  but no instability. She is using brace but not crutches.  She can walk.  Past Medical History:  Diagnosis Date   Antepartum mild preeclampsia 11/14/2016   Formatting of this note might be different from the original. Seen for repeat BP check in triage 12/04/16 Rescue BMZ 10/8 - 10/9  HELLP labs normal/stable 12/16/16, except urine P/C 10.7 Dr. Cyndie Chime spoke with Dr. Particia Nearing (MFM) on 12/18/16 regarding patients clinical care with severe BP on 10/19 (though not 4h apart) and worsening proteinuria with urine P/C of 10.7 (previously 0.3 in August).    Migraine without aura and without status migrainosus, not intractable 07/02/2019   Pregnancy induced hypertension    Rh negative state in antepartum period 06/09/2016   Short cervix affecting pregnancy 09/26/2016    Past Surgical History:  Procedure Laterality Date   CESAREAN SECTION  12/20/2016   DILATION AND CURETTAGE OF UTERUS     WISDOM TOOTH EXTRACTION      Family History  Problem Relation Age of Onset   Diabetes Mother    Diabetes Maternal Uncle    Diabetes Maternal Grandmother     Social History   Socioeconomic History   Marital status: Single    Spouse name: Not on file   Number of children: 2   Years of education: Not on file   Highest education level: Not on file  Occupational History   Occupation: CNA  Tobacco Use   Smoking status: Never   Smokeless tobacco: Never  Substance and Sexual Activity   Alcohol use: Yes    Alcohol/week: 2.0 standard drinks    Types: 2 Shots of liquor per week    Comment: Every 6 months   Drug use: No   Sexual activity: Not Currently    Birth  control/protection: None  Other Topics Concern   Not on file  Social History Narrative   Not on file   Social Determinants of Health   Financial Resource Strain: Not on file  Food Insecurity: Not on file  Transportation Needs: Not on file  Physical Activity: Not on file  Stress: Not on file  Social Connections: Not on file  Intimate Partner Violence: Not on file    Outpatient Medications Prior to Visit  Medication Sig Dispense Refill   amphetamine-dextroamphetamine (ADDERALL) 10 MG tablet Take 1 tablet (10 mg total) by mouth daily with breakfast. 30 tablet 0   Elastic Bandages & Supports (ANKLE BRACE DELUXE LACED/L-XL) MISC 1 each by Does not apply route daily. 1 each 0   escitalopram (LEXAPRO) 20 MG tablet Take 1 tablet (20 mg total) by mouth daily. 30 tablet 6   JUNEL FE 1.5/30 1.5-30 MG-MCG tablet Take 1 tablet by mouth daily.     pantoprazole (PROTONIX) 40 MG tablet Take 1 tablet (40 mg total) by mouth daily. 30 tablet 6   valACYclovir (VALTREX) 500 MG tablet Take 1 tablet (500 mg total) by mouth daily. 30 tablet 6   No facility-administered medications prior to visit.    No Known Allergies  ROS Review of Systems  Constitutional:  Negative for activity  change and appetite change.  HENT:  Negative for congestion.   Eyes:  Negative for visual disturbance.  Respiratory:  Negative for chest tightness and shortness of breath.   Cardiovascular:  Negative for chest pain.  Gastrointestinal:  Negative for abdominal distention.  Endocrine: Negative for polyuria.  Genitourinary:  Negative for difficulty urinating and dysuria.  Musculoskeletal:  Positive for joint swelling (right ankle).  Neurological: Negative.      Objective:    Physical Exam Vitals reviewed.  Constitutional:      Appearance: Normal appearance. She is obese.  HENT:     Right Ear: Tympanic membrane, ear canal and external ear normal.     Left Ear: Tympanic membrane, ear canal and external ear normal.   Cardiovascular:     Rate and Rhythm: Normal rate and regular rhythm.     Pulses: Normal pulses.     Heart sounds: Normal heart sounds.  Musculoskeletal:        General: Swelling and tenderness (lateral mallous) present.     Cervical back: Normal range of motion.  Neurological:     General: No focal deficit present.     Mental Status: She is alert and oriented to person, place, and time.    BP 120/80   Pulse 66   Temp 97.9 F (36.6 C)   Resp 16   Ht 5\' 3"  (1.6 m)   Wt 221 lb (100.2 kg)   LMP 07/11/2020 (Approximate)   SpO2 98%   BMI 39.15 kg/m  Wt Readings from Last 3 Encounters:  08/10/20 221 lb (100.2 kg)  07/28/20 222 lb (100.7 kg)  03/25/20 210 lb (95.3 kg)     Health Maintenance Due  Topic Date Due   HIV Screening  Never done   Hepatitis C Screening  Never done   PAP SMEAR-Modifier  Never done    There are no preventive care reminders to display for this patient.  No results found for: TSH Lab Results  Component Value Date   WBC 12.0 (H) 07/02/2019   HGB 14.5 07/02/2019   HCT 43.8 07/02/2019   MCV 85 07/02/2019   PLT 326 07/02/2019   Lab Results  Component Value Date   NA 144 07/02/2019   K 3.9 07/02/2019   CO2 24 07/02/2019   GLUCOSE 86 07/02/2019   BUN 14 07/02/2019   CREATININE 0.67 07/02/2019   BILITOT 0.7 07/02/2019   ALKPHOS 59 07/02/2019   AST 26 07/02/2019   ALT 49 (H) 07/02/2019   PROT 7.6 07/02/2019   ALBUMIN 4.6 07/02/2019   CALCIUM 10.0 07/02/2019   ANIONGAP 11 10/24/2016   No results found for: CHOL No results found for: HDL No results found for: LDLCALC No results found for: TRIG No results found for: CHOLHDL No results found for: 10/26/2016    Assessment & Plan:  Diagnoses and all orders for this visit: Sprain of anterior talofibular ligament of right ankle, subsequent encounter  Continue home exercises, we discussed physical therapy but she does not want this.  Continue with brace.  Note for limited walking and standing at  work for 2 more weeks   Follow-up: Return in about 3 weeks (around 08/31/2020) for ankle sprain.    11/01/2020, MD

## 2020-09-02 ENCOUNTER — Ambulatory Visit: Payer: Commercial Managed Care - PPO | Admitting: Legal Medicine

## 2020-09-07 ENCOUNTER — Telehealth: Payer: Self-pay

## 2020-09-07 NOTE — Telephone Encounter (Signed)
I left a message on the number(s) listed in the patients chart requesting the patient to call back regarding the upcomming appointment for 09/09/2020. The provider is out of the office that afternoon. The appointment has been canceled. Waiting for the patient to return the call.  

## 2020-09-09 ENCOUNTER — Other Ambulatory Visit: Payer: Self-pay

## 2020-09-09 ENCOUNTER — Ambulatory Visit: Payer: Commercial Managed Care - PPO | Admitting: Legal Medicine

## 2020-09-09 ENCOUNTER — Encounter: Payer: Self-pay | Admitting: Legal Medicine

## 2020-09-09 VITALS — BP 110/88 | HR 93 | Temp 97.3°F | Ht 63.0 in | Wt 225.6 lb

## 2020-09-09 DIAGNOSIS — F902 Attention-deficit hyperactivity disorder, combined type: Secondary | ICD-10-CM | POA: Diagnosis not present

## 2020-09-09 DIAGNOSIS — S93491D Sprain of other ligament of right ankle, subsequent encounter: Secondary | ICD-10-CM

## 2020-09-09 DIAGNOSIS — K219 Gastro-esophageal reflux disease without esophagitis: Secondary | ICD-10-CM | POA: Diagnosis not present

## 2020-09-09 DIAGNOSIS — F411 Generalized anxiety disorder: Secondary | ICD-10-CM | POA: Diagnosis not present

## 2020-09-09 DIAGNOSIS — B009 Herpesviral infection, unspecified: Secondary | ICD-10-CM

## 2020-09-09 DIAGNOSIS — Z6839 Body mass index (BMI) 39.0-39.9, adult: Secondary | ICD-10-CM

## 2020-09-09 DIAGNOSIS — A6004 Herpesviral vulvovaginitis: Secondary | ICD-10-CM

## 2020-09-09 MED ORDER — AMPHETAMINE-DEXTROAMPHETAMINE 10 MG PO TABS
10.0000 mg | ORAL_TABLET | Freq: Every day | ORAL | 0 refills | Status: DC
Start: 2020-09-09 — End: 2020-11-12

## 2020-09-09 MED ORDER — PANTOPRAZOLE SODIUM 40 MG PO TBEC
40.0000 mg | DELAYED_RELEASE_TABLET | Freq: Every day | ORAL | 6 refills | Status: DC
Start: 2020-09-09 — End: 2022-11-30

## 2020-09-09 MED ORDER — VALACYCLOVIR HCL 500 MG PO TABS
500.0000 mg | ORAL_TABLET | Freq: Every day | ORAL | 6 refills | Status: DC
Start: 2020-09-09 — End: 2022-06-19

## 2020-09-09 NOTE — Progress Notes (Signed)
Established Patient Office Visit  Subjective:  Patient ID: Anita Peters, female    DOB: Mar 14, 1990  Age: 30 y.o. MRN: [id number]  CC:  Chief Complaint  Patient presents with   Ankle Injury   Gastroesophageal Reflux   ADHD    HPI Anita Peters presents for chronic visit  Right ankle sprain healing well, stable, can walk on it , no pain.  Patient has ADHD for all life  and on, adderall, only uses at work.  GERD Patient has gastroesophageal reflux symptoms with esophagitis and LTRD.  The symptoms are moderate intensity.  Length of symptoms 10 years.  Medicines include off medicines.  Complications include none.   Past Medical History:  Diagnosis Date   Antepartum mild preeclampsia 11/14/2016   Formatting of this note might be different from the original. Seen for repeat BP check in triage 12/04/16 Rescue BMZ 10/8 - 10/9  HELLP labs normal/stable 12/16/16, except urine P/C 10.7 Dr. Cyndie Chime spoke with Dr. Particia Nearing (MFM) on 12/18/16 regarding patients clinical care with severe BP on 10/19 (though not 4h apart) and worsening proteinuria with urine P/C of 10.7 (previously 0.3 in August).    Migraine without aura and without status migrainosus, not intractable 07/02/2019   Pregnancy induced hypertension    Rh negative state in antepartum period 06/09/2016   Short cervix affecting pregnancy 09/26/2016    Past Surgical History:  Procedure Laterality Date   CESAREAN SECTION  12/20/2016   DILATION AND CURETTAGE OF UTERUS     WISDOM TOOTH EXTRACTION      Family History  Problem Relation Age of Onset   Diabetes Mother    Diabetes Maternal Uncle    Diabetes Maternal Grandmother     Social History   Socioeconomic History   Marital status: Single    Spouse name: Not on file   Number of children: 2   Years of education: Not on file   Highest education level: Not on file  Occupational History   Occupation: CNA  Tobacco Use   Smoking status: Never   Smokeless tobacco: Never   Substance and Sexual Activity   Alcohol use: Not Currently    Alcohol/week: 2.0 standard drinks    Types: 2 Shots of liquor per week    Comment: Every 6 months   Drug use: No   Sexual activity: Not Currently    Birth control/protection: None  Other Topics Concern   Not on file  Social History Narrative   Not on file   Social Determinants of Health   Financial Resource Strain: Not on file  Food Insecurity: Not on file  Transportation Needs: Not on file  Physical Activity: Not on file  Stress: Not on file  Social Connections: Not on file  Intimate Partner Violence: Not on file    Outpatient Medications Prior to Visit  Medication Sig Dispense Refill   Elastic Bandages & Supports (ANKLE BRACE DELUXE LACED/L-XL) MISC 1 each by Does not apply route daily. 1 each 0   escitalopram (LEXAPRO) 20 MG tablet Take 1 tablet (20 mg total) by mouth daily. 30 tablet 6   JUNEL FE 1.5/30 1.5-30 MG-MCG tablet Take 1 tablet by mouth daily.     amphetamine-dextroamphetamine (ADDERALL) 10 MG tablet Take 1 tablet (10 mg total) by mouth daily with breakfast. 30 tablet 0   pantoprazole (PROTONIX) 40 MG tablet Take 1 tablet (40 mg total) by mouth daily. 30 tablet 6   valACYclovir (VALTREX) 500 MG tablet Take 1 tablet (500 mg  total) by mouth daily. 30 tablet 6   No facility-administered medications prior to visit.    No Known Allergies  ROS Review of Systems  Constitutional:  Negative for activity change and appetite change.  HENT:  Negative for congestion.   Eyes:  Negative for visual disturbance.  Respiratory:  Negative for chest tightness and shortness of breath.   Cardiovascular:  Negative for chest pain, palpitations and leg swelling.  Gastrointestinal:  Negative for abdominal distention and abdominal pain.  Genitourinary:  Negative for difficulty urinating.  Musculoskeletal:  Positive for arthralgias.  Neurological: Negative.   Psychiatric/Behavioral: Negative.       Objective:     Physical Exam Vitals reviewed.  Constitutional:      General: She is not in acute distress.    Appearance: Normal appearance. She is obese.  HENT:     Head: Normocephalic.     Right Ear: Tympanic membrane, ear canal and external ear normal.     Left Ear: Tympanic membrane, ear canal and external ear normal.     Mouth/Throat:     Mouth: Mucous membranes are moist.     Pharynx: Oropharynx is clear.  Eyes:     Extraocular Movements: Extraocular movements intact.     Conjunctiva/sclera: Conjunctivae normal.     Pupils: Pupils are equal, round, and reactive to light.  Cardiovascular:     Rate and Rhythm: Normal rate.     Pulses: Normal pulses.     Heart sounds: Normal heart sounds. No murmur heard.   No gallop.  Abdominal:     General: Abdomen is flat. Bowel sounds are normal. There is no distension.     Palpations: Abdomen is soft.     Tenderness: There is no abdominal tenderness.  Musculoskeletal:        General: Normal range of motion.     Cervical back: Normal range of motion and neck supple.  Skin:    General: Skin is warm.     Capillary Refill: Capillary refill takes less than 2 seconds.  Neurological:     General: No focal deficit present.     Mental Status: She is alert and oriented to person, place, and time. Mental status is at baseline.    BP 110/88   Pulse 93   Temp (!) 97.3 F (36.3 C)   Ht 5\' 3"  (1.6 m)   Wt 225 lb 9.6 oz (102.3 kg)   SpO2 97%   BMI 39.96 kg/m  Wt Readings from Last 3 Encounters:  09/09/20 225 lb 9.6 oz (102.3 kg)  08/10/20 221 lb (100.2 kg)  07/28/20 222 lb (100.7 kg)   Depression screen [facility] 2/9 09/09/2020 09/09/2020 07/02/2019  Decreased Interest 1 0 1  Down, Depressed, Hopeless 0 0 1  PHQ - 2 Score 1 0 2  Altered sleeping 1 - 2  Tired, decreased energy 2 - 2  Change in appetite 1 - 1  Feeling bad or failure about yourself  1 - 0  Trouble concentrating 1 - 2  Moving slowly or fidgety/restless 0 - 1  Suicidal thoughts 0 - 0  PHQ-9  Score 7 - 10  Difficult doing work/chores Somewhat difficult - -      Health Maintenance Due  Topic Date Due   HIV Screening  Never done   Hepatitis C Screening  Never done   PAP SMEAR-Modifier  Never done    There are no preventive care reminders to display for this patient.  No results found for: TSH  Lab Results  Component Value Date   WBC 12.0 (H) 07/02/2019   HGB 14.5 07/02/2019   HCT 43.8 07/02/2019   MCV 85 07/02/2019   PLT 326 07/02/2019   Lab Results  Component Value Date   NA 144 07/02/2019   K 3.9 07/02/2019   CO2 24 07/02/2019   GLUCOSE 86 07/02/2019   BUN 14 07/02/2019   CREATININE 0.67 07/02/2019   BILITOT 0.7 07/02/2019   ALKPHOS 59 07/02/2019   AST 26 07/02/2019   ALT 49 (H) 07/02/2019   PROT 7.6 07/02/2019   ALBUMIN 4.6 07/02/2019   CALCIUM 10.0 07/02/2019   ANIONGAP 11 10/24/2016   No results found for: CHOL No results found for: HDL No results found for: LDLCALC No results found for: TRIG No results found for: CHOLHDL No results found for: [id number]    Assessment & Plan:   Problem List Items Addressed This Visit       Digestive   Gastroesophageal reflux disease without esophagitis   Relevant Medications   pantoprazole (PROTONIX) 40 MG tablet Plan of care was formulated today.  She is doing well.  A plan of care was formulated using patient exam, tests and other sources to optimize care using evidence based information.  Recommend no smoking, no eating after supper, avoid fatty foods, elevate Head of bed, avoid tight fitting clothing.  Continue on protonix.      Musculoskeletal and Integument   Sprain of anterior talofibular ligament of right ankle Patient has good healing of left ankle but still some pain on insertion of peroneus brevis tendon.  We will watch for healing     Genitourinary   Herpesviral vulvovaginitis   Relevant Medications   valACYclovir (VALTREX) 500 MG tablet   Other Relevant Orders   CBC with  Differential/Platelet Use valtrex for breakouts      Other   Generalized anxiety disorder - Primary   Relevant Orders   TSH Patient  has GAD and stable    Attention deficit hyperactivity disorder, combined type   Relevant Medications   amphetamine-dextroamphetamine (ADDERALL) 10 MG tablet Patient has chronic ADD and doing well on medicines   Other Visit Diagnoses     Herpes       Relevant Medications   valACYclovir (VALTREX) 500 MG tablet   BMI 39.0-39.9,adult       Relevant Orders   Comprehensive metabolic panel   Lipid Panel An individualize plan was formulated for obesity using patient history and physical exam to encourage weight loss.  An evidence based program was formulated.  Patient is to cut portion size with meals and to plan physical exercise 3 days a week at least 20 minutes.  Weight watchers and other programs are helpful.  Planned amount of weight loss 10 lbs. Patient meets criteria for morbid obesity  Morbid obesity An individualize plan was formulated for obesity using patient history and physical exam to encourage weight loss.  An evidence based program was formulated.  Patient is to cut portion size with meals and to plan physical exercise 3 days a week at least 20 minutes.  Weight watchers and other programs are helpful.  Planned amount of weight loss 10 lbs.        Meds ordered this encounter  Medications   amphetamine-dextroamphetamine (ADDERALL) 10 MG tablet    Sig: Take 1 tablet (10 mg total) by mouth daily with breakfast.    Dispense:  30 tablet    Refill:  0    Need to  see before any more refills   pantoprazole (PROTONIX) 40 MG tablet    Sig: Take 1 tablet (40 mg total) by mouth daily.    Dispense:  30 tablet    Refill:  6   valACYclovir (VALTREX) 500 MG tablet    Sig: Take 1 tablet (500 mg total) by mouth daily.    Dispense:  30 tablet    Refill:  6    Follow-up: Return in about 3 months (around 12/10/2020), or apointment with shannon for  PAP, for adhd.    Brent Bulla, MD

## 2020-09-10 LAB — LIPID PANEL
Chol/HDL Ratio: 7.4 ratio — ABNORMAL HIGH (ref 0.0–4.4)
Cholesterol, Total: 215 mg/dL — ABNORMAL HIGH (ref 100–199)
HDL: 29 mg/dL — ABNORMAL LOW (ref >39)
LDL Chol Calc (NIH): 156 mg/dL — ABNORMAL HIGH (ref 0–99)
Triglycerides: 164 mg/dL — ABNORMAL HIGH (ref 0–149)
VLDL Cholesterol Cal: 30 mg/dL (ref 5–40)

## 2020-09-10 LAB — COMPREHENSIVE METABOLIC PANEL
ALT: 31 IU/L (ref 0–32)
AST: 18 IU/L (ref 0–40)
Albumin/Globulin Ratio: 1.4 (ref 1.2–2.2)
Albumin: 4.2 g/dL (ref 3.9–5.0)
Alkaline Phosphatase: 59 IU/L (ref 44–121)
BUN/Creatinine Ratio: 16 (ref 9–23)
BUN: 12 mg/dL (ref 6–20)
Bilirubin Total: 0.2 mg/dL (ref 0.0–1.2)
CO2: 21 mmol/L (ref 20–29)
Calcium: 9.3 mg/dL (ref 8.7–10.2)
Chloride: 103 mmol/L (ref 96–106)
Creatinine, Ser: 0.77 mg/dL (ref 0.57–1.00)
Globulin, Total: 2.9 g/dL (ref 1.5–4.5)
Glucose: 133 mg/dL — ABNORMAL HIGH (ref 65–99)
Potassium: 4.9 mmol/L (ref 3.5–5.2)
Sodium: 139 mmol/L (ref 134–144)
Total Protein: 7.1 g/dL (ref 6.0–8.5)
eGFR: 106 mL/min/{1.73_m2} (ref >59)

## 2020-09-10 LAB — CBC WITH DIFFERENTIAL/PLATELET
Basophils Absolute: 0.1 10*3/uL (ref 0.0–0.2)
Basos: 1 %
EOS (ABSOLUTE): 0.2 10*3/uL (ref 0.0–0.4)
Eos: 3 %
Hematocrit: 44.1 % (ref 34.0–46.6)
Hemoglobin: 14.7 g/dL (ref 11.1–15.9)
Immature Grans (Abs): 0 10*3/uL (ref 0.0–0.1)
Immature Granulocytes: 0 %
Lymphocytes Absolute: 3.4 10*3/uL — ABNORMAL HIGH (ref 0.7–3.1)
Lymphs: 38 %
MCH: 28.6 pg (ref 26.6–33.0)
MCHC: 33.3 g/dL (ref 31.5–35.7)
MCV: 86 fL (ref 79–97)
Monocytes Absolute: 0.6 10*3/uL (ref 0.1–0.9)
Monocytes: 7 %
Neutrophils Absolute: 4.6 10*3/uL (ref 1.4–7.0)
Neutrophils: 51 %
Platelets: 302 10*3/uL (ref 150–450)
RBC: 5.14 x10E6/uL (ref 3.77–5.28)
RDW: 13.2 % (ref 11.7–15.4)
WBC: 9 10*3/uL (ref 3.4–10.8)

## 2020-09-10 LAB — CARDIOVASCULAR RISK ASSESSMENT

## 2020-09-10 LAB — TSH: TSH: 3.08 u[IU]/mL (ref 0.450–4.500)

## 2020-09-10 NOTE — Progress Notes (Signed)
Glucose 133, kidney tests normal, liver tests normal, CBC normal, triglycerides 164 high, LDL cholesterol 156 high need to start DASH diet to lower cholesterol lp

## 2020-10-05 ENCOUNTER — Encounter: Payer: Commercial Managed Care - PPO | Admitting: Nurse Practitioner

## 2020-11-12 ENCOUNTER — Other Ambulatory Visit: Payer: Self-pay

## 2020-11-12 DIAGNOSIS — F902 Attention-deficit hyperactivity disorder, combined type: Secondary | ICD-10-CM

## 2020-11-12 MED ORDER — AMPHETAMINE-DEXTROAMPHETAMINE 10 MG PO TABS: 10.0000 mg | ORAL_TABLET | Freq: Every day | ORAL | 0 refills | Status: DC

## 2021-02-11 ENCOUNTER — Other Ambulatory Visit: Payer: Self-pay

## 2021-02-11 DIAGNOSIS — F902 Attention-deficit hyperactivity disorder, combined type: Secondary | ICD-10-CM

## 2021-02-11 MED ORDER — AMPHETAMINE-DEXTROAMPHETAMINE 10 MG PO TABS: 10.0000 mg | ORAL_TABLET | Freq: Every day | ORAL | 0 refills | Status: DC

## 2021-05-11 ENCOUNTER — Other Ambulatory Visit: Payer: Self-pay

## 2021-05-11 DIAGNOSIS — F902 Attention-deficit hyperactivity disorder, combined type: Secondary | ICD-10-CM

## 2021-05-16 NOTE — Progress Notes (Signed)
? ?Subjective:  ?Patient ID: Anita Peters, female    DOB: 1991/02/07  Age: 31 y.o. MRN: [id number] ? ?Chief Complaint  ?Patient presents with  ? Gastroesophageal Reflux  ? Anxiety  ? ADHD  ? Sinusitis  ? ? ?HPI ?ADD: Patient is taking Adderall 10 mg daily. 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 ? ?GAD: She is taking escitalopram 20 mg daily. Doing well on medicine. ? ?GERD: Taking Pantoprazole 40 mg daily. She is stable on medicines ? ?Patient mentioned sinus pressure, sinus headaches, ear fullness. She denied fever, chills, sore throat. ? ?Current Outpatient Medications on File Prior to Visit  ?Medication Sig Dispense Refill  ? escitalopram (LEXAPRO) 20 MG tablet Take 1 tablet (20 mg total) by mouth daily. 30 tablet 6  ? pantoprazole (PROTONIX) 40 MG tablet Take 1 tablet (40 mg total) by mouth daily. 30 tablet 6  ? valACYclovir (VALTREX) 500 MG tablet Take 1 tablet (500 mg total) by mouth daily. 30 tablet 6  ? JUNEL FE 1.5/30 1.5-30 MG-MCG tablet Take 1 tablet by mouth daily. (Patient not taking: Reported on 05/17/2021)    ? ?No current facility-administered medications on file prior to visit.  ? ?Past Medical History:  ?Diagnosis Date  ? Antepartum mild preeclampsia 11/14/2016  ? Formatting of this note might be different from the original. Seen for repeat BP check in triage 12/04/16 Rescue BMZ 10/8 - 10/9  HELLP labs normal/stable 12/16/16, except urine P/C 10.7 Dr. Cyndie ChimeNguyen spoke with Dr. Particia NearingMartha Decker (MFM) on 12/18/16 regarding patients clinical care with severe BP on 10/19 (though not 4h apart) and worsening proteinuria with urine P/C of 10.7 (previously 0.3 in August).   ? Migraine without aura and without status migrainosus, not intractable 07/02/2019  ? Pregnancy induced hypertension   ? Rh negative state in antepartum period 06/09/2016  ? Short cervix affecting pregnancy 09/26/2016  ? ?Past Surgical History:  ?Procedure Laterality Date  ? CESAREAN SECTION  12/20/2016  ? DILATION AND CURETTAGE  OF UTERUS    ? WISDOM TOOTH EXTRACTION    ?  ?Family History  ?Problem Relation Age of Onset  ? Diabetes Mother   ? Diabetes Maternal Uncle   ? Diabetes Maternal Grandmother   ? ?Social History  ? ?Socioeconomic History  ? Marital status: Single  ?  Spouse name: Not on file  ? Number of children: 2  ? Years of education: Not on file  ? Highest education level: Not on file  ?Occupational History  ? Occupation: CNA  ?Tobacco Use  ? Smoking status: Never  ? Smokeless tobacco: Never  ?Substance and Sexual Activity  ? Alcohol use: Not Currently  ?  Alcohol/week: 2.0 standard drinks  ?  Types: 2 Shots of liquor per week  ?  Comment: Every 6 months  ? Drug use: No  ? Sexual activity: Not Currently  ?  Birth control/protection: None  ?Other Topics Concern  ? Not on file  ?Social History Narrative  ? Not on file  ? ?Social Determinants of Health  ? ?Financial Resource Strain: Not on file  ?Food Insecurity: Not on file  ?Transportation Needs: Not on file  ?Physical Activity: Not on file  ?Stress: Not on file  ?Social Connections: Not on file  ? ? ?Review of Systems  ?Constitutional:  Negative for chills, fatigue and fever.  ?HENT:  Positive for congestion and sinus pressure. Negative for ear pain and sore throat.   ?Respiratory:  Negative for cough and  shortness of breath.   ?Cardiovascular:  Negative for chest pain and palpitations.  ?Gastrointestinal:  Negative for abdominal pain, constipation, diarrhea, nausea and vomiting.  ?Endocrine: Negative for polydipsia, polyphagia and polyuria.  ?Genitourinary:  Negative for difficulty urinating and dysuria.  ?Musculoskeletal:  Negative for arthralgias, back pain and myalgias.  ?Skin:  Negative for rash.  ?Neurological:  Negative for headaches.  ?Psychiatric/Behavioral:  Negative for dysphoric mood. The patient is not nervous/anxious.   ? ? ?Objective:  ?BP 100/60   Pulse 78   Temp 98.5 ?F (36.9 ?C)   Resp 15   Ht 5\' 3"  (1.6 m)   Wt 231 lb (104.8 kg)   LMP 05/04/2021   BMI  40.92 kg/m?  ? ?BP/Weight 05/17/2021 09/09/2020 08/10/2020  ?Systolic BP 100 110 120  ?Diastolic BP 60 88 80  ?Wt. (Lbs) 231 225.6 221  ?BMI 40.92 39.96 39.15  ? ? ?Physical Exam ?Vitals reviewed.  ?Constitutional:   ?   General: She is not in acute distress. ?   Appearance: Normal appearance. She is obese.  ?HENT:  ?   Head: Normocephalic.  ?   Right Ear: Tympanic membrane normal.  ?   Left Ear: Tympanic membrane normal.  ?   Nose: Congestion present.  ?   Mouth/Throat:  ?   Mouth: Mucous membranes are moist.  ?Eyes:  ?   Extraocular Movements: Extraocular movements intact.  ?   Conjunctiva/sclera: Conjunctivae normal.  ?   Pupils: Pupils are equal, round, and reactive to light.  ?Cardiovascular:  ?   Rate and Rhythm: Normal rate and regular rhythm.  ?   Pulses: Normal pulses.  ?   Heart sounds: Normal heart sounds. No murmur heard. ?  No gallop.  ?Pulmonary:  ?   Effort: Pulmonary effort is normal. No respiratory distress.  ?   Breath sounds: Normal breath sounds. No wheezing.  ?Abdominal:  ?   General: Abdomen is flat. Bowel sounds are normal. There is no distension.  ?   Tenderness: There is no abdominal tenderness.  ?Musculoskeletal:  ?   Cervical back: Normal range of motion.  ?   Right lower leg: No edema.  ?   Left lower leg: No edema.  ?Skin: ?   General: Skin is warm and dry.  ?   Capillary Refill: Capillary refill takes less than 2 seconds.  ?Neurological:  ?   General: No focal deficit present.  ?   Mental Status: She is alert and oriented to person, place, and time. Mental status is at baseline.  ? ? ? ?  ? ?Lab Results  ?Component Value Date  ? WBC 9.0 09/09/2020  ? HGB 14.7 09/09/2020  ? HCT 44.1 09/09/2020  ? PLT 302 09/09/2020  ? GLUCOSE 133 (H) 09/09/2020  ? CHOL 215 (H) 09/09/2020  ? TRIG 164 (H) 09/09/2020  ? HDL 29 (L) 09/09/2020  ? LDLCALC 156 (H) 09/09/2020  ? ALT 31 09/09/2020  ? AST 18 09/09/2020  ? NA 139 09/09/2020  ? K 4.9 09/09/2020  ? CL 103 09/09/2020  ? CREATININE 0.77 09/09/2020  ? BUN  12 09/09/2020  ? CO2 21 09/09/2020  ? TSH 3.080 09/09/2020  ? ? ? ? ?Assessment & Plan:  ? ?Problem List Items Addressed This Visit   ? ?  ? Digestive  ? Gastroesophageal reflux disease without esophagitis - Primary ?Plan of care was formulated today.  She is doing well.  A plan of care was formulated using patient exam, tests and  other sources to optimize care using evidence based information.  Recommend no smoking, no eating after supper, avoid fatty foods, elevate Head of bed, avoid tight fitting clothing.  Continue on pantoprazole.   ?  ? Other  ? Generalized anxiety disorder ?Patient has GAD on lexapro and doing well ?  ? Attention deficit hyperactivity disorder, combined type  ? Relevant Medications  ? amphetamine-dextroamphetamine (ADDERALL) 10 MG tablet ?Continue adderall for ADD  ? ?Other Visit Diagnoses   ? ? Seasonal allergic rhinitis due to pollen      ? Relevant Medications  ? predniSONE (STERAPRED UNI-PAK 21 TAB) 10 MG (21) TBPK tablet ?Treat allergies with prednisone pack, can use saline spray, stopp afrin  ? ?  ?. ? ? ? ?  ? ?Follow-up: Return in about 3 months (around 08/17/2021). ? ?An After Visit Summary was printed and given to the patient. ? ?Brent Bulla, MD ?[facility] ?[phone]

## 2021-05-17 ENCOUNTER — Other Ambulatory Visit: Payer: Self-pay

## 2021-05-17 ENCOUNTER — Encounter: Payer: Self-pay | Admitting: Legal Medicine

## 2021-05-17 ENCOUNTER — Ambulatory Visit: Payer: Commercial Managed Care - PPO | Admitting: Legal Medicine

## 2021-05-17 VITALS — BP 100/60 | HR 78 | Temp 98.5°F | Resp 15 | Ht 63.0 in | Wt 231.0 lb

## 2021-05-17 DIAGNOSIS — F902 Attention-deficit hyperactivity disorder, combined type: Secondary | ICD-10-CM | POA: Diagnosis not present

## 2021-05-17 DIAGNOSIS — J301 Allergic rhinitis due to pollen: Secondary | ICD-10-CM

## 2021-05-17 DIAGNOSIS — K219 Gastro-esophageal reflux disease without esophagitis: Secondary | ICD-10-CM | POA: Diagnosis not present

## 2021-05-17 DIAGNOSIS — F411 Generalized anxiety disorder: Secondary | ICD-10-CM | POA: Diagnosis not present

## 2021-05-17 MED ORDER — AMPHETAMINE-DEXTROAMPHETAMINE 10 MG PO TABS: 10.0000 mg | ORAL_TABLET | Freq: Every day | ORAL | 0 refills | Status: DC

## 2021-05-17 MED ORDER — PREDNISONE 10 MG (21) PO TBPK: ORAL_TABLET | ORAL | 0 refills | Status: DC

## 2021-08-12 ENCOUNTER — Other Ambulatory Visit: Payer: Self-pay

## 2021-08-12 DIAGNOSIS — F902 Attention-deficit hyperactivity disorder, combined type: Secondary | ICD-10-CM

## 2021-08-12 MED ORDER — AMPHETAMINE-DEXTROAMPHETAMINE 10 MG PO TABS: 10.0000 mg | ORAL_TABLET | Freq: Every day | ORAL | 0 refills | Status: DC

## 2021-08-15 ENCOUNTER — Ambulatory Visit: Payer: Commercial Managed Care - PPO | Admitting: Family Medicine

## 2021-08-15 ENCOUNTER — Encounter: Payer: Self-pay | Admitting: Family Medicine

## 2021-08-15 ENCOUNTER — Other Ambulatory Visit: Payer: Self-pay

## 2021-08-15 VITALS — BP 110/72 | HR 78 | Temp 97.2°F | Resp 16 | Ht 63.0 in | Wt 229.0 lb

## 2021-08-15 DIAGNOSIS — J208 Acute bronchitis due to other specified organisms: Secondary | ICD-10-CM | POA: Insufficient documentation

## 2021-08-15 DIAGNOSIS — J189 Pneumonia, unspecified organism: Secondary | ICD-10-CM | POA: Insufficient documentation

## 2021-08-15 DIAGNOSIS — H65192 Other acute nonsuppurative otitis media, left ear: Secondary | ICD-10-CM | POA: Diagnosis not present

## 2021-08-15 MED ORDER — CEFTRIAXONE SODIUM 1 G IJ SOLR
1.0000 g | Freq: Once | INTRAMUSCULAR | Status: AC
  Administered 2021-08-15: 1 g via INTRAMUSCULAR

## 2021-08-15 MED ORDER — PREDNISONE 10 MG PO TABS: ORAL_TABLET | ORAL | 0 refills | Status: AC

## 2021-08-15 MED ORDER — HYDROCODONE BIT-HOMATROP MBR 5-1.5 MG/5ML PO SOLN: 5.0000 mL | Freq: Four times a day (QID) | ORAL | 0 refills | Status: DC | PRN

## 2021-08-15 MED ORDER — TRIAMCINOLONE ACETONIDE 40 MG/ML IJ SUSP
80.0000 mg | Freq: Once | INTRAMUSCULAR | Status: AC
  Administered 2021-08-15: 80 mg via INTRAMUSCULAR

## 2021-08-15 MED ORDER — AZITHROMYCIN 250 MG PO TABS: ORAL_TABLET | ORAL | 0 refills | Status: DC

## 2021-08-15 MED ORDER — ALBUTEROL SULFATE HFA 108 (90 BASE) MCG/ACT IN AERS: 2.0000 | INHALATION_SPRAY | Freq: Four times a day (QID) | RESPIRATORY_TRACT | 0 refills | Status: DC | PRN

## 2021-08-15 NOTE — Progress Notes (Unsigned)
Acute Office Visit  Subjective:    Patient ID: Anita [facility], female    DOB: February 12, 1991, 31 y.o.   MRN: [id number]  Chief Complaint  Patient presents with   Cough    HPI: Patient is in today for cough and chest congestion which started about 3 weeks ago.  She has used OTC cough medication with no relief.  She denies fever or chills.  She reports that her Mom and her two daughters have been sick with similar symptoms.  Her daughters were seen at Urgent Care and treated for viral conjunctivitis and cough.    Past Medical History:  Diagnosis Date   Antepartum mild preeclampsia 11/14/2016   Formatting of this note might be different from the original. Seen for repeat BP check in triage 12/04/16 Rescue BMZ 10/8 - 10/9  HELLP labs normal/stable 12/16/16, except urine P/C 10.7 Dr. Alfonse Spruce spoke with Dr. Renella Cunas (MFM) on 12/18/16 regarding patients clinical care with severe BP on 10/19 (though not 4h apart) and worsening proteinuria with urine P/C of 10.7 (previously 0.3 in August).    Migraine without aura and without status migrainosus, not intractable 07/02/2019   Pregnancy induced hypertension    Rh negative state in antepartum period 06/09/2016   Short cervix affecting pregnancy 09/26/2016    Past Surgical History:  Procedure Laterality Date   CESAREAN SECTION  12/20/2016   DILATION AND CURETTAGE OF UTERUS     WISDOM TOOTH EXTRACTION      Family History  Problem Relation Age of Onset   Hyperlipidemia Mother    Diabetes Mother    Diabetes Maternal Uncle    Diabetes Maternal Grandmother     Social History   Socioeconomic History   Marital status: Single    Spouse name: Not on file   Number of children: 2   Years of education: Not on file   Highest education level: Not on file  Occupational History   Occupation: CNA  Tobacco Use   Smoking status: Never   Smokeless tobacco: Never  Substance and Sexual Activity   Alcohol use: Not Currently    Alcohol/week: 2.0 standard  drinks of alcohol    Types: 2 Shots of liquor per week    Comment: Every 6 months   Drug use: No   Sexual activity: Not Currently    Birth control/protection: None  Other Topics Concern   Not on file  Social History Narrative   Not on file   Social Determinants of Health   Financial Resource Strain: Not on file  Food Insecurity: Not on file  Transportation Needs: Not on file  Physical Activity: Not on file  Stress: Not on file  Social Connections: Not on file  Intimate Partner Violence: Not on file    Outpatient Medications Prior to Visit  Medication Sig Dispense Refill   amphetamine-dextroamphetamine (ADDERALL) 10 MG tablet Take 1 tablet (10 mg total) by mouth daily with breakfast. 30 tablet 0   escitalopram (LEXAPRO) 20 MG tablet Take 1 tablet (20 mg total) by mouth daily. 30 tablet 6   JUNEL FE 1.5/30 1.5-30 MG-MCG tablet Take 1 tablet by mouth daily. (Patient not taking: Reported on 05/17/2021)     pantoprazole (PROTONIX) 40 MG tablet Take 1 tablet (40 mg total) by mouth daily. 30 tablet 6   valACYclovir (VALTREX) 500 MG tablet Take 1 tablet (500 mg total) by mouth daily. 30 tablet 6   predniSONE (STERAPRED UNI-PAK 21 TAB) 10 MG (21) TBPK tablet Take 6ills first  day , then 5 pills day 2 and then cut down one pill day until gone 21 tablet 0   No facility-administered medications prior to visit.    No Known Allergies  Review of Systems  Constitutional:  Positive for fatigue. Negative for chills and fever.  Respiratory:  Positive for cough, chest tightness, shortness of breath and wheezing.   Gastrointestinal:  Negative for nausea and vomiting.  Neurological:  Positive for headaches.       Objective:    Physical Exam Vitals reviewed.  Constitutional:      Appearance: Normal appearance.  HENT:     Right Ear: Tympanic membrane, ear canal and external ear normal.     Left Ear: Ear canal and external ear normal.     Ears:     Comments: Left tm erythema.    Nose:  Nose normal.     Mouth/Throat:     Pharynx: Oropharynx is clear. Posterior oropharyngeal erythema present. No oropharyngeal exudate.  Cardiovascular:     Rate and Rhythm: Normal rate and regular rhythm.     Heart sounds: Normal heart sounds. No murmur heard. Pulmonary:     Effort: Pulmonary effort is normal. No respiratory distress.     Breath sounds: Wheezing (BL bases) and rhonchi (RUL,) present.  Lymphadenopathy:     Cervical: No cervical adenopathy.  Neurological:     Mental Status: She is alert and oriented to person, place, and time.  Psychiatric:        Mood and Affect: Mood normal.        Behavior: Behavior normal.     BP 110/72   Pulse 78   Temp (!) 97.2 F (36.2 C)   Resp 16   Ht 5' 3"  (1.6 m)   Wt 229 lb (103.9 kg)   BMI 40.57 kg/m  Wt Readings from Last 3 Encounters:  08/15/21 229 lb (103.9 kg)  05/17/21 231 lb (104.8 kg)  09/09/20 225 lb 9.6 oz (102.3 kg)    Health Maintenance Due  Topic Date Due   HIV Screening  Never done   Hepatitis C Screening  Never done   PAP SMEAR-Modifier  Never done    There are no preventive care reminders to display for this patient.   Lab Results  Component Value Date   TSH 3.080 09/09/2020   Lab Results  Component Value Date   WBC 9.0 09/09/2020   HGB 14.7 09/09/2020   HCT 44.1 09/09/2020   MCV 86 09/09/2020   PLT 302 09/09/2020   Lab Results  Component Value Date   NA 139 09/09/2020   K 4.9 09/09/2020   CO2 21 09/09/2020   GLUCOSE 133 (H) 09/09/2020   BUN 12 09/09/2020   CREATININE 0.77 09/09/2020   BILITOT 0.2 09/09/2020   ALKPHOS 59 09/09/2020   AST 18 09/09/2020   ALT 31 09/09/2020   PROT 7.1 09/09/2020   ALBUMIN 4.2 09/09/2020   CALCIUM 9.3 09/09/2020   ANIONGAP 11 10/24/2016   EGFR 106 09/09/2020   Lab Results  Component Value Date   CHOL 215 (H) 09/09/2020   Lab Results  Component Value Date   HDL 29 (L) 09/09/2020   Lab Results  Component Value Date   LDLCALC 156 (H) 09/09/2020    Lab Results  Component Value Date   TRIG 164 (H) 09/09/2020   Lab Results  Component Value Date   CHOLHDL 7.4 (H) 09/09/2020   No results found for: "HGBA1C"     Assessment &  Plan:   Problem List Items Addressed This Visit       Respiratory   Acute bronchitis due to other specified organisms - Primary   Relevant Medications   triamcinolone acetonide (KENALOG-40) injection 80 mg (Start on 08/15/2021  3:15 PM)   azithromycin (ZITHROMAX) 250 MG tablet   predniSONE (DELTASONE) 10 MG tablet   HYDROcodone bit-homatropine (HYDROMET) 5-1.5 MG/5ML syrup   albuterol (VENTOLIN HFA) 108 (90 Base) MCG/ACT inhaler   Pneumonia of left upper lobe due to infectious organism   Relevant Medications   azithromycin (ZITHROMAX) 250 MG tablet   HYDROcodone bit-homatropine (HYDROMET) 5-1.5 MG/5ML syrup   albuterol (VENTOLIN HFA) 108 (90 Base) MCG/ACT inhaler     Nervous and Auditory   Otitis media    Z-Pak given.      Relevant Medications   azithromycin (ZITHROMAX) 250 MG tablet   Meds ordered this encounter  Medications   cefTRIAXone (ROCEPHIN) injection 1 g   triamcinolone acetonide (KENALOG-40) injection 80 mg   azithromycin (ZITHROMAX) 250 MG tablet    Sig: 2 DAILY FOR FIRST DAY, THEN DECREASE TO ONE DAILY FOR 4 MORE DAYS.    Dispense:  6 tablet    Refill:  0   predniSONE (DELTASONE) 10 MG tablet    Sig: Take 6 tablets (60 mg total) by mouth daily with breakfast for 1 day, THEN 5 tablets (50 mg total) daily with breakfast for 1 day, THEN 4 tablets (40 mg total) daily with breakfast for 1 day, THEN 3 tablets (30 mg total) daily with breakfast for 1 day, THEN 2 tablets (20 mg total) daily with breakfast for 1 day, THEN 1 tablet (10 mg total) daily with breakfast for 1 day.    Dispense:  21 tablet    Refill:  0   HYDROcodone bit-homatropine (HYDROMET) 5-1.5 MG/5ML syrup    Sig: Take 5 mLs by mouth every 6 (six) hours as needed for cough.    Dispense:  120 mL    Refill:  0    albuterol (VENTOLIN HFA) 108 (90 Base) MCG/ACT inhaler    Sig: Inhale 2 puffs into the lungs every 6 (six) hours as needed for wheezing or shortness of breath.    Dispense:  8 g    Refill:  0    Follow-up: No follow-ups on file.  An After Visit Summary was printed and given to the patient.  Rochel Brome, MD [facility] [phone]

## 2021-08-15 NOTE — Patient Instructions (Signed)
Rocephin 1 g IM given. Kenalog 80 mg IM given. Prescription: Z-Pak given,  Prednisone pack, albuterol, and Hydromet cough syrup.

## 2021-08-15 NOTE — Assessment & Plan Note (Signed)
Z-Pak given 

## 2021-08-24 ENCOUNTER — Ambulatory Visit: Payer: Commercial Managed Care - PPO | Admitting: Legal Medicine

## 2021-11-16 ENCOUNTER — Ambulatory Visit (INDEPENDENT_AMBULATORY_CARE_PROVIDER_SITE_OTHER): Payer: Commercial Managed Care - PPO | Admitting: Family Medicine

## 2021-11-16 ENCOUNTER — Other Ambulatory Visit: Payer: Self-pay | Admitting: Legal Medicine

## 2021-11-16 VITALS — BP 124/64 | HR 93 | Temp 97.1°F | Ht 63.0 in | Wt 234.0 lb

## 2021-11-16 DIAGNOSIS — B86 Scabies: Secondary | ICD-10-CM | POA: Insufficient documentation

## 2021-11-16 DIAGNOSIS — F411 Generalized anxiety disorder: Secondary | ICD-10-CM

## 2021-11-16 DIAGNOSIS — F902 Attention-deficit hyperactivity disorder, combined type: Secondary | ICD-10-CM | POA: Diagnosis not present

## 2021-11-16 DIAGNOSIS — K219 Gastro-esophageal reflux disease without esophagitis: Secondary | ICD-10-CM | POA: Diagnosis not present

## 2021-11-16 MED ORDER — TRIAMCINOLONE ACETONIDE 40 MG/ML IJ SUSP
80.0000 mg | Freq: Once | INTRAMUSCULAR | Status: AC
  Administered 2021-11-16: 80 mg via INTRAMUSCULAR

## 2021-11-16 MED ORDER — ESCITALOPRAM OXALATE 20 MG PO TABS: 20.0000 mg | ORAL_TABLET | Freq: Every day | ORAL | 6 refills | Status: DC

## 2021-11-16 MED ORDER — AMPHETAMINE-DEXTROAMPHETAMINE 10 MG PO TABS: 10.0000 mg | ORAL_TABLET | Freq: Every day | ORAL | 0 refills | Status: DC

## 2021-11-16 MED ORDER — PERMETHRIN 5 % EX CREA: TOPICAL_CREAM | CUTANEOUS | 0 refills | Status: DC

## 2021-11-16 NOTE — Progress Notes (Signed)
Acute Office Visit  Subjective:    Patient ID: Anita Peters, female    DOB: 15-May-1990, 31 y.o.   MRN: [id number]  Chief Complaint  Patient presents with   Urticaria    HPI: Patient is in today for hives, states she noticed the hives a week ago, has not been exposed to anything no changes in laundry detergent or food, has red bumps on BL arms and legs, hips and abdomen, places are itchy. Has tried hydrocortisone which has not helped.  ADHD: On adderal 10 mg daily  Working well.  GAD: on lexapro 20 mg daily. Working well. GERD: on pantoprazole 40 mg daily.   Past Medical History:  Diagnosis Date   Antepartum mild preeclampsia 11/14/2016   Formatting of this note might be different from the original. Seen for repeat BP check in triage 12/04/16 Rescue BMZ 10/8 - 10/9  HELLP labs normal/stable 12/16/16, except urine P/C 10.7 Dr. Alfonse Spruce spoke with Dr. Renella Cunas (MFM) on 12/18/16 regarding patients clinical care with severe BP on 10/19 (though not 4h apart) and worsening proteinuria with urine P/C of 10.7 (previously 0.3 in August).    Migraine without aura and without status migrainosus, not intractable 07/02/2019   Pregnancy induced hypertension    Rh negative state in antepartum period 06/09/2016   Short cervix affecting pregnancy 09/26/2016    Past Surgical History:  Procedure Laterality Date   CESAREAN SECTION  12/20/2016   DILATION AND CURETTAGE OF UTERUS     WISDOM TOOTH EXTRACTION      Family History  Problem Relation Age of Onset   Hyperlipidemia Mother    Diabetes Mother    Diabetes Maternal Uncle    Diabetes Maternal Grandmother     Social History   Socioeconomic History   Marital status: Single    Spouse name: Not on file   Number of children: 2   Years of education: Not on file   Highest education level: Not on file  Occupational History   Occupation: CNA  Tobacco Use   Smoking status: Never   Smokeless tobacco: Never  Substance and Sexual Activity    Alcohol use: Not Currently    Alcohol/week: 2.0 standard drinks of alcohol    Types: 2 Shots of liquor per week    Comment: Every 6 months   Drug use: No   Sexual activity: Not Currently    Birth control/protection: None  Other Topics Concern   Not on file  Social History Narrative   Not on file   Social Determinants of Health   Financial Resource Strain: Not on file  Food Insecurity: Not on file  Transportation Needs: Not on file  Physical Activity: Not on file  Stress: Not on file  Social Connections: Not on file  Intimate Partner Violence: Not on file    Outpatient Medications Prior to Visit  Medication Sig Dispense Refill   albuterol (VENTOLIN HFA) 108 (90 Base) MCG/ACT inhaler Inhale 2 puffs into the lungs every 6 (six) hours as needed for wheezing or shortness of breath. 8 g 0   pantoprazole (PROTONIX) 40 MG tablet Take 1 tablet (40 mg total) by mouth daily. 30 tablet 6   valACYclovir (VALTREX) 500 MG tablet Take 1 tablet (500 mg total) by mouth daily. 30 tablet 6   amphetamine-dextroamphetamine (ADDERALL) 10 MG tablet Take 1 tablet (10 mg total) by mouth daily with breakfast. 30 tablet 0   azithromycin (ZITHROMAX) 250 MG tablet 2 DAILY FOR FIRST DAY, THEN DECREASE TO  ONE DAILY FOR 4 MORE DAYS. 6 tablet 0   escitalopram (LEXAPRO) 20 MG tablet Take 1 tablet (20 mg total) by mouth daily. 30 tablet 6   HYDROcodone bit-homatropine (HYDROMET) 5-1.5 MG/5ML syrup Take 5 mLs by mouth every 6 (six) hours as needed for cough. 120 mL 0   JUNEL FE 1.5/30 1.5-30 MG-MCG tablet Take 1 tablet by mouth daily. (Patient not taking: Reported on 05/17/2021)     No facility-administered medications prior to visit.    No Known Allergies  Review of Systems  Constitutional:  Negative for chills and fever.  Skin:  Positive for rash.       Objective:    Physical Exam Vitals reviewed.  Constitutional:      Appearance: Normal appearance.  Skin:    Findings: Rash (on forearms, hands,  abdomen, chest and back. excoriations. found in web spaces.) present.  Neurological:     Mental Status: She is alert.     BP 124/64   Pulse 93   Temp (!) 97.1 F (36.2 C)   Ht 5' 3" (1.6 m)   Wt 234 lb (106.1 kg)   SpO2 97%   BMI 41.45 kg/m  Wt Readings from Last 3 Encounters:  11/16/21 234 lb (106.1 kg)  08/15/21 229 lb (103.9 kg)  05/17/21 231 lb (104.8 kg)    Health Maintenance Due  Topic Date Due   COVID-19 Vaccine (1) Never done   HIV Screening  Never done   Hepatitis C Screening  Never done   PAP SMEAR-Modifier  Never done   INFLUENZA VACCINE  09/27/2021    There are no preventive care reminders to display for this patient.   Lab Results  Component Value Date   TSH 3.080 09/09/2020   Lab Results  Component Value Date   WBC 9.0 09/09/2020   HGB 14.7 09/09/2020   HCT 44.1 09/09/2020   MCV 86 09/09/2020   PLT 302 09/09/2020   Lab Results  Component Value Date   NA 139 09/09/2020   K 4.9 09/09/2020   CO2 21 09/09/2020   GLUCOSE 133 (H) 09/09/2020   BUN 12 09/09/2020   CREATININE 0.77 09/09/2020   BILITOT 0.2 09/09/2020   ALKPHOS 59 09/09/2020   AST 18 09/09/2020   ALT 31 09/09/2020   PROT 7.1 09/09/2020   ALBUMIN 4.2 09/09/2020   CALCIUM 9.3 09/09/2020   ANIONGAP 11 10/24/2016   EGFR 106 09/09/2020   Lab Results  Component Value Date   CHOL 215 (H) 09/09/2020   Lab Results  Component Value Date   HDL 29 (L) 09/09/2020   Lab Results  Component Value Date   LDLCALC 156 (H) 09/09/2020   Lab Results  Component Value Date   TRIG 164 (H) 09/09/2020   Lab Results  Component Value Date   CHOLHDL 7.4 (H) 09/09/2020   No results found for: "HGBA1C"     Assessment & Plan:   Problem List Items Addressed This Visit       Digestive   Gastroesophageal reflux disease without esophagitis    The current medical regimen is effective;  continue present plan and medications. Continue pantoprazole 40 mg daily.         Musculoskeletal and  Integument   Scabies - Primary    Education given.  Permethrim given. Use on children who sleep with her.       Relevant Medications   permethrin (ELIMITE) 5 % cream     Other   Generalized anxiety disorder  The current medical regimen is effective;  continue present plan and medications. Continue lexapro 20 mg daily.       Relevant Medications   escitalopram (LEXAPRO) 20 MG tablet   Attention deficit hyperactivity disorder, combined type    The current medical regimen is effective;  continue present plan and medications. Continue adderal 10 mg qam.      Relevant Medications   amphetamine-dextroamphetamine (ADDERALL) 10 MG tablet   Meds ordered this encounter  Medications   escitalopram (LEXAPRO) 20 MG tablet    Sig: Take 1 tablet (20 mg total) by mouth daily.    Dispense:  30 tablet    Refill:  6   triamcinolone acetonide (KENALOG-40) injection 80 mg   permethrin (ELIMITE) 5 % cream    Sig: Apply 5% cream to entire body from the neck down, then wash off after 8 to 14 hours    Dispense:  120 g    Refill:  0   amphetamine-dextroamphetamine (ADDERALL) 10 MG tablet    Sig: Take 1 tablet (10 mg total) by mouth daily with breakfast.    Dispense:  30 tablet    Refill:  0    No orders of the defined types were placed in this encounter.    Follow-up: Return in about 3 months (around 02/15/2022).  An After Visit Summary was printed and given to the patient.  Rochel Brome, MD [facility] [phone]

## 2021-11-20 ENCOUNTER — Encounter: Payer: Self-pay | Admitting: Family Medicine

## 2021-11-20 NOTE — Assessment & Plan Note (Signed)
The current medical regimen is effective;  continue present plan and medications. Continue adderal 10 mg qam.

## 2021-11-20 NOTE — Assessment & Plan Note (Signed)
The current medical regimen is effective;  continue present plan and medications. Continue lexapro 20 mg daily.  

## 2021-11-20 NOTE — Assessment & Plan Note (Signed)
Education given.  Permethrim given. Use on children who sleep with her.

## 2021-11-20 NOTE — Assessment & Plan Note (Signed)
The current medical regimen is effective;  continue present plan and medications. °Continue pantoprazole 40 mg daily  °

## 2022-02-24 ENCOUNTER — Other Ambulatory Visit: Payer: Self-pay

## 2022-02-24 DIAGNOSIS — F902 Attention-deficit hyperactivity disorder, combined type: Secondary | ICD-10-CM

## 2022-02-24 MED ORDER — AMPHETAMINE-DEXTROAMPHETAMINE 10 MG PO TABS: 10.0000 mg | ORAL_TABLET | Freq: Every day | ORAL | 0 refills | Status: DC

## 2022-02-24 NOTE — Telephone Encounter (Signed)
Patient called requesting refill of Adderall be sent to [facility].  Last refilled 11/16/21 #30

## 2022-03-08 ENCOUNTER — Encounter: Payer: Commercial Managed Care - PPO | Admitting: Family Medicine

## 2022-03-08 NOTE — Progress Notes (Deleted)
Subjective:  Patient ID: Anita Peters, female    DOB: Jan 12, 1991  Age: 32 y.o. MRN: [id number]  No chief complaint on file.   HPI Well Adult Physical: Patient here for a comprehensive physical exam.The patient reports {problems:16946} Do you take any herbs or supplements that were not prescribed by a doctor? {yes/no/not asked:9010} Are you taking calcium supplements? {yes/no:63} Are you taking aspirin daily? {yes/no:63}  Encounter for general adult medical examination without abnormal findings  Physical ("At Risk" items are starred): Patient's last physical exam was 1 year ago .  Patient is not afflicted from Stress Incontinence and Urge Incontinence  Patient wears a seat belt, has smoke detectors, has carbon monoxide detectors, practices appropriate gun safety, and wears sunscreen with extended sun exposure. Dental Care: biannual cleanings, brushes and flosses daily. Ophthalmology/Optometry: Annual visit.  Hearing loss: none Vision impairments: none  Menarche: *** Menstrual History: *** LMP: *** Pregnancy history: *** Safe at home: *** Self breast exams: ***  ADHD: Patient is taking Adderall 10 mg daily. 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  GAD: She is taking escitalopram 20 mg daily. Doing well on medicine.  GERD: Taking Pantoprazole 40 mg daily. She is stable on medicines Flowsheet Row Office Visit from 09/09/2020 in [facility]  PHQ-2 Total Score 1               Social Hx   Social History   Socioeconomic History   Marital status: Single    Spouse name: Not on file   Number of children: 2   Years of education: Not on file   Highest education level: Not on file  Occupational History   Occupation: CNA  Tobacco Use   Smoking status: Never   Smokeless tobacco: Never  Substance and Sexual Activity   Alcohol use: Not Currently    Alcohol/week: 2.0 standard drinks of alcohol    Types: 2 Shots of liquor per week    Comment: Every 6  months   Drug use: No   Sexual activity: Not Currently    Birth control/protection: None  Other Topics Concern   Not on file  Social History Narrative   Not on file   Social Determinants of Health   Financial Resource Strain: Not on file  Food Insecurity: Not on file  Transportation Needs: Not on file  Physical Activity: Not on file  Stress: Not on file  Social Connections: Not on file   Past Medical History:  Diagnosis Date   Antepartum mild preeclampsia 11/14/2016   Formatting of this note might be different from the original. Seen for repeat BP check in triage 12/04/16 Rescue BMZ 10/8 - 10/9  HELLP labs normal/stable 12/16/16, except urine P/C 10.7 Dr. Alfonse Spruce spoke with Dr. Renella Cunas (MFM) on 12/18/16 regarding patients clinical care with severe BP on 10/19 (though not 4h apart) and worsening proteinuria with urine P/C of 10.7 (previously 0.3 in August).    Migraine without aura and without status migrainosus, not intractable 07/02/2019   Pregnancy induced hypertension    Rh negative state in antepartum period 06/09/2016   Short cervix affecting pregnancy 09/26/2016   Past Surgical History:  Procedure Laterality Date   CESAREAN SECTION  12/20/2016   DILATION AND CURETTAGE OF UTERUS     WISDOM TOOTH EXTRACTION      Family History  Problem Relation Age of Onset   Hyperlipidemia Mother    Diabetes Mother    Diabetes Maternal Uncle  Diabetes Maternal Grandmother     Review of Systems  Constitutional:  Negative for chills, fatigue and fever.  HENT:  Negative for congestion, ear pain and sore throat.   Respiratory:  Negative for cough and shortness of breath.   Cardiovascular:  Negative for chest pain and palpitations.  Gastrointestinal:  Negative for abdominal pain, constipation, diarrhea, nausea and vomiting.  Endocrine: Negative for polydipsia, polyphagia and polyuria.  Genitourinary:  Negative for difficulty urinating and dysuria.  Musculoskeletal:  Negative for  arthralgias, back pain and myalgias.  Skin:  Negative for rash.  Neurological:  Negative for headaches.  Psychiatric/Behavioral:  Negative for dysphoric mood. The patient is not nervous/anxious.      Objective:  There were no vitals taken for this visit.     11/16/2021    3:46 PM 08/15/2021    2:18 PM 05/17/2021    1:26 PM  BP/Weight  Systolic BP 606 301 601  Diastolic BP 64 72 60  Wt. (Lbs) 234 229 231  BMI 41.45 kg/m2 40.57 kg/m2 40.92 kg/m2    Physical Exam  Lab Results  Component Value Date   WBC 9.0 09/09/2020   HGB 14.7 09/09/2020   HCT 44.1 09/09/2020   PLT 302 09/09/2020   GLUCOSE 133 (H) 09/09/2020   CHOL 215 (H) 09/09/2020   TRIG 164 (H) 09/09/2020   HDL 29 (L) 09/09/2020   LDLCALC 156 (H) 09/09/2020   ALT 31 09/09/2020   AST 18 09/09/2020   NA 139 09/09/2020   K 4.9 09/09/2020   CL 103 09/09/2020   CREATININE 0.77 09/09/2020   BUN 12 09/09/2020   CO2 21 09/09/2020   TSH 3.080 09/09/2020      Assessment & Plan:   Problem List Items Addressed This Visit   None     There is no height or weight on file to calculate BMI.   These are the goals we discussed:  Goals   None      This is a list of the screening recommended for you and due dates:  Health Maintenance  Topic Date Due   COVID-19 Vaccine (1) Never done   HIV Screening  Never done   Hepatitis C Screening: USPSTF Recommendation to screen - Ages 26-79 yo.  Never done   Pap Smear  Never done   Flu Shot  09/27/2021   DTaP/Tdap/Td vaccine (2 - Td or Tdap) 10/25/2026   HPV Vaccine  Aged Out     No orders of the defined types were placed in this encounter.   Follow-up: No follow-ups on file.  An After Visit Summary was printed and given to the patient.  Rochel Brome, MD [facility] [phone]

## 2022-03-12 NOTE — Progress Notes (Signed)
This encounter was created in error - please disregard.

## 2022-05-01 ENCOUNTER — Ambulatory Visit (INDEPENDENT_AMBULATORY_CARE_PROVIDER_SITE_OTHER): Payer: Commercial Managed Care - PPO | Admitting: Family Medicine

## 2022-05-01 ENCOUNTER — Other Ambulatory Visit: Payer: Self-pay | Admitting: Family Medicine

## 2022-05-01 ENCOUNTER — Encounter: Payer: Self-pay | Admitting: Family Medicine

## 2022-05-01 VITALS — BP 116/64 | HR 91 | Temp 97.1°F | Ht 63.0 in | Wt 243.0 lb

## 2022-05-01 DIAGNOSIS — Z114 Encounter for screening for human immunodeficiency virus [HIV]: Secondary | ICD-10-CM | POA: Diagnosis not present

## 2022-05-01 DIAGNOSIS — Z124 Encounter for screening for malignant neoplasm of cervix: Secondary | ICD-10-CM

## 2022-05-01 DIAGNOSIS — Z6841 Body Mass Index (BMI) 40.0 and over, adult: Secondary | ICD-10-CM

## 2022-05-01 DIAGNOSIS — Z0001 Encounter for general adult medical examination with abnormal findings: Secondary | ICD-10-CM

## 2022-05-01 DIAGNOSIS — Z3009 Encounter for other general counseling and advice on contraception: Secondary | ICD-10-CM

## 2022-05-01 DIAGNOSIS — Z113 Encounter for screening for infections with a predominantly sexual mode of transmission: Secondary | ICD-10-CM

## 2022-05-01 LAB — POCT URINE PREGNANCY: Preg Test, Ur: NEGATIVE

## 2022-05-01 MED ORDER — NORETHIN ACE-ETH ESTRAD-FE 1.5-30 MG-MCG PO TABS: 1.0000 | ORAL_TABLET | Freq: Every day | ORAL | 3 refills | Status: DC

## 2022-05-01 NOTE — Progress Notes (Signed)
Subjective:  Patient ID: Anita Peters, female    DOB: 1990/04/03  Age: 32 y.o. MRN: [id number]  Chief Complaint  Patient presents with   Annual Exam    HPI Well Adult Physical: Patient here for a comprehensive physical exam.The patient reports no problems Do you take any herbs or supplements that were not prescribed by a doctor? no Are you taking calcium supplements? no Are you taking aspirin daily? not applicable  Encounter for general adult medical examination without abnormal findings  Physical ("At Risk" items are starred): Patient's last physical exam was 1 year ago.  Patient wears a seat belts Patient has smoke detectors Patient practices appropriate gun safety. Patient wears sunscreen with extended sun exposure. Dental Care: biannual cleanings, brushes and flosses daily.  Ophthalmology/Optometry: [facility]) Hearing loss: none Vision impairments: Glasses  Menarche: 32 y.o Menstrual History: regular LMP: 04/10/2022 Pregnancy history: 2 (1 miscarriage, 1 set of twins) Safe at home: yes Self breast exams: no     05/01/2022    2:45 PM 09/09/2020   12:09 PM 09/09/2020    9:54 AM 07/02/2019   11:13 AM  Depression screen PHQ 2/9  Decreased Interest 0 1 0 1  Down, Depressed, Hopeless 0 0 0 1  PHQ - 2 Score 0 1 0 2  Altered sleeping  1  2  Tired, decreased energy  2  2  Change in appetite  1  1  Feeling bad or failure about yourself   1  0  Trouble concentrating  1  2  Moving slowly or fidgety/restless  0  1  Suicidal thoughts  0  0  PHQ-9 Score  7  10  Difficult doing work/chores  Somewhat difficult           10/17/2016    3:27 PM 10/24/2016    2:42 PM 11/07/2016   12:36 PM 09/09/2020   10:51 AM 05/01/2022    2:45 PM  Fall Risk  Falls in the past year? No No No 1 0  Was there an injury with Fall?    1 0  Fall Risk Category Calculator    2 0  Fall Risk Category (Retired)    Moderate   (RETIRED) Patient Fall Risk Level    Low fall risk   Patient at Risk for  Falls Due to     No Fall Risks  Fall risk Follow up    Falls evaluation completed Falls evaluation completed             Social Hx   Social History   Socioeconomic History   Marital status: Single    Spouse name: Not on file   Number of children: 2   Years of education: Not on file   Highest education level: Not on file  Occupational History   Occupation: CNA  Tobacco Use   Smoking status: Never   Smokeless tobacco: Never  Substance and Sexual Activity   Alcohol use: Not Currently    Alcohol/week: 2.0 standard drinks of alcohol    Types: 2 Shots of liquor per week    Comment: Every 6 months   Drug use: No   Sexual activity: Not Currently    Birth control/protection: None  Other Topics Concern   Not on file  Social History Narrative   Not on file   Social Determinants of Health   Financial Resource Strain: Low Risk  (05/01/2022)   Overall Financial Resource Strain (CARDIA)    Difficulty of Paying  Living Expenses: Not hard at all  Food Insecurity: No Food Insecurity (05/01/2022)   Hunger Vital Sign    Worried About Running Out of Food in the Last Year: Never true    Ran Out of Food in the Last Year: Never true  Transportation Needs: No Transportation Needs (05/01/2022)   PRAPARE - Hydrologist (Medical): No    Lack of Transportation (Non-Medical): No  Physical Activity: Inactive (05/01/2022)   Exercise Vital Sign    Days of Exercise per Week: 0 days    Minutes of Exercise per Session: 0 min  Stress: No Stress Concern Present (05/01/2022)   [place]    Feeling of Stress : Not at all  Social Connections: Socially Isolated (05/01/2022)   Social Connection and Isolation Panel [NHANES]    Frequency of Communication with Friends and Family: Three times a week    Frequency of Social Gatherings with Friends and Family: Three times a week    Attends Religious Services: Never    Active  Member of Clubs or Organizations: No    Attends Archivist Meetings: Never    Marital Status: Never married   Past Medical History:  Diagnosis Date   Antepartum mild preeclampsia 11/14/2016   Formatting of this note might be different from the original. Seen for repeat BP check in triage 12/04/16 Rescue BMZ 10/8 - 10/9  HELLP labs normal/stable 12/16/16, except urine P/C 10.7 Dr. Alfonse Spruce spoke with Dr. Renella Cunas (MFM) on 12/18/16 regarding patients clinical care with severe BP on 10/19 (though not 4h apart) and worsening proteinuria with urine P/C of 10.7 (previously 0.3 in August).    Migraine without aura and without status migrainosus, not intractable 07/02/2019   Pregnancy induced hypertension    Rh negative state in antepartum period 06/09/2016   Short cervix affecting pregnancy 09/26/2016   Past Surgical History:  Procedure Laterality Date   CESAREAN SECTION  12/20/2016   DILATION AND CURETTAGE OF UTERUS     WISDOM TOOTH EXTRACTION      Family History  Problem Relation Age of Onset   Hyperlipidemia Mother    Diabetes Mother    Diabetes Maternal Uncle    Diabetes Maternal Grandmother     Review of Systems  Constitutional:  Negative for chills, fatigue and fever.  HENT:  Negative for congestion, ear pain, rhinorrhea and sore throat.   Respiratory:  Negative for cough and shortness of breath.   Cardiovascular:  Negative for chest pain.  Gastrointestinal:  Negative for abdominal pain, constipation, diarrhea, nausea and vomiting.  Genitourinary:  Negative for dysuria and urgency.  Musculoskeletal:  Negative for back pain and myalgias.  Neurological:  Negative for dizziness, weakness, light-headedness and headaches.  Psychiatric/Behavioral:  Negative for dysphoric mood. The patient is not nervous/anxious.      Objective:  BP 116/64   Pulse 91   Temp (!) 97.1 F (36.2 C)   Ht 5\' 3"  (1.6 m)   Wt 243 lb (110.2 kg)   LMP 04/10/2022 (Approximate)   SpO2 99%   BMI  43.05 kg/m      05/01/2022    2:42 PM 11/16/2021    3:46 PM 08/15/2021    2:18 PM  BP/Weight  Systolic BP 779 390 300  Diastolic BP 64 64 72  Wt. (Lbs) 243 234 229  BMI 43.05 kg/m2 41.45 kg/m2 40.57 kg/m2    Physical Exam Vitals reviewed. Exam conducted with a  chaperone present.  Constitutional:      Appearance: Normal appearance. She is obese.  HENT:     Right Ear: Tympanic membrane normal.     Left Ear: Tympanic membrane normal.     Nose: Nose normal.     Mouth/Throat:     Pharynx: No oropharyngeal exudate or posterior oropharyngeal erythema.  Eyes:     Conjunctiva/sclera: Conjunctivae normal.  Neck:     Vascular: No carotid bruit.  Cardiovascular:     Rate and Rhythm: Normal rate and regular rhythm.     Pulses: Normal pulses.     Heart sounds: Normal heart sounds.  Pulmonary:     Effort: Pulmonary effort is normal.     Breath sounds: Normal breath sounds.  Abdominal:     General: Bowel sounds are normal.     Palpations: There is no mass.     Tenderness: There is no abdominal tenderness.  Genitourinary:    General: Normal vulva.     Exam position: Lithotomy position.     Vagina: Normal. No vaginal discharge.     Cervix: Normal.  Skin:    General: Skin is warm and dry.     Findings: No lesion.  Neurological:     Mental Status: She is alert and oriented to person, place, and time.  Psychiatric:        Mood and Affect: Mood normal.        Behavior: Behavior normal.     Lab Results  Component Value Date   WBC 9.0 05/01/2022   HGB 13.2 05/01/2022   HCT 41.3 05/01/2022   PLT 327 05/01/2022   GLUCOSE 144 (H) 05/01/2022   CHOL 196 05/01/2022   TRIG 159 (H) 05/01/2022   HDL 33 (L) 05/01/2022   LDLCALC 134 (H) 05/01/2022   ALT 97 (H) 05/01/2022   AST 46 (H) 05/01/2022   NA 138 05/01/2022   K 4.5 05/01/2022   CL 101 05/01/2022   CREATININE 0.74 05/01/2022   BUN 13 05/01/2022   CO2 21 05/01/2022   TSH 1.650 05/01/2022      Assessment & Plan:   Encounter for general adult medical examination with abnormal findings Things to do to keep yourself healthy  - Exercise at least 30-45 minutes a day, 3-4 days a week.  - Eat a low-fat diet with lots of fruits and vegetables, up to 7-9 servings per day.  - Seatbelts can save your life. Wear them always.  - Smoke detectors on every level of your home, check batteries every year.  - Eye Doctor - have an eye exam every 1-2 years  - Dentist twice a year - Safe sex - if you may be exposed to STDs, use a condom.  - Alcohol -  If you drink, do it moderately, less than 2 drinks per day.  - [place]. Choose someone to speak for you if you are not able.  - Depression is common in our stressful world.If you're feeling down or losing interest in things you normally enjoy, please come in for a visit.  - Violence - If anyone is threatening or hurting you, please call immediately.   Encounter for counseling regarding contraception Checked urine pregnancy test. Sent [facility].    Body mass index is 43.05 kg/m.   These are the goals we discussed:  Goals      DIET - REDUCE FAT INTAKE     DIET - REDUCE SUGAR INTAKE     Exercise  150 min/wk Moderate Activity         This is a list of the screening recommended for you and due dates:  Health Maintenance  Topic Date Due   Pap Smear  Never done   COVID-19 Vaccine (1) 05/17/2022*   DTaP/Tdap/Td vaccine (2 - Td or Tdap) 10/25/2026   Flu Shot  Completed   Hepatitis C Screening: USPSTF Recommendation to screen - Ages 18-79 yo.  Completed   HIV Screening  Completed   HPV Vaccine  Aged Out  *Topic was postponed. The date shown is not the original due date.     Meds ordered this encounter  Medications   norethindrone-ethinyl estradiol-iron (JUNEL FE 1.5/30) 1.5-30 MG-MCG tablet    Sig: Take 1 tablet by mouth daily.    Dispense:  84 tablet    Refill:  3    Follow-up: Return if symptoms worsen or fail to improve.  An After  Visit Summary was printed and given to the patient.  Rochel Brome, MD [facility] [phone]

## 2022-05-01 NOTE — Patient Instructions (Signed)
Things to do to keep yourself healthy  - Exercise at least 30-45 minutes a day, 3-4 days a week.  - Eat a low-fat diet with lots of fruits and vegetables, up to 7-9 servings per day.  - Seatbelts can save your life. Wear them always.  - Smoke detectors on every level of your home, check batteries every year.  - Eye Doctor - have an eye exam every 1-2 years  - Dentist twice a year - Safe sex - if you may be exposed to STDs, use a condom.  - Alcohol -  If you drink, do it moderately, less than 2 drinks per day.  - [place]. Choose someone to speak for you if you are not able.  - Depression is common in our stressful world.If you're feeling down or losing interest in things you normally enjoy, please come in for a visit.  - Violence - If anyone is threatening or hurting you, please call immediately.

## 2022-05-02 LAB — HIV ANTIBODY (ROUTINE TESTING W REFLEX): HIV Screen 4th Generation wRfx: NONREACTIVE

## 2022-05-02 LAB — CBC WITH DIFFERENTIAL/PLATELET
Basophils Absolute: 0.1 10*3/uL (ref 0.0–0.2)
Basos: 1 %
EOS (ABSOLUTE): 0.2 10*3/uL (ref 0.0–0.4)
Eos: 2 %
Hematocrit: 41.3 % (ref 34.0–46.6)
Hemoglobin: 13.2 g/dL (ref 11.1–15.9)
Immature Grans (Abs): 0.1 10*3/uL (ref 0.0–0.1)
Immature Granulocytes: 1 %
Lymphocytes Absolute: 2.4 10*3/uL (ref 0.7–3.1)
Lymphs: 27 %
MCH: 26.4 pg — ABNORMAL LOW (ref 26.6–33.0)
MCHC: 32 g/dL (ref 31.5–35.7)
MCV: 83 fL (ref 79–97)
Monocytes Absolute: 0.7 10*3/uL (ref 0.1–0.9)
Monocytes: 8 %
Neutrophils Absolute: 5.6 10*3/uL (ref 1.4–7.0)
Neutrophils: 61 %
Platelets: 327 10*3/uL (ref 150–450)
RBC: 5 x10E6/uL (ref 3.77–5.28)
RDW: 13.7 % (ref 11.7–15.4)
WBC: 9 10*3/uL (ref 3.4–10.8)

## 2022-05-02 LAB — COMPREHENSIVE METABOLIC PANEL
ALT: 97 IU/L — ABNORMAL HIGH (ref 0–32)
AST: 46 IU/L — ABNORMAL HIGH (ref 0–40)
Albumin/Globulin Ratio: 1.7 (ref 1.2–2.2)
Albumin: 4.2 g/dL (ref 3.9–4.9)
Alkaline Phosphatase: 67 IU/L (ref 44–121)
BUN/Creatinine Ratio: 18 (ref 9–23)
BUN: 13 mg/dL (ref 6–20)
Bilirubin Total: 0.3 mg/dL (ref 0.0–1.2)
CO2: 21 mmol/L (ref 20–29)
Calcium: 9.3 mg/dL (ref 8.7–10.2)
Chloride: 101 mmol/L (ref 96–106)
Creatinine, Ser: 0.74 mg/dL (ref 0.57–1.00)
Globulin, Total: 2.5 g/dL (ref 1.5–4.5)
Glucose: 144 mg/dL — ABNORMAL HIGH (ref 70–99)
Potassium: 4.5 mmol/L (ref 3.5–5.2)
Sodium: 138 mmol/L (ref 134–144)
Total Protein: 6.7 g/dL (ref 6.0–8.5)
eGFR: 110 mL/min/{1.73_m2} (ref >59)

## 2022-05-02 LAB — LIPID PANEL
Chol/HDL Ratio: 5.9 ratio — ABNORMAL HIGH (ref 0.0–4.4)
Cholesterol, Total: 196 mg/dL (ref 100–199)
HDL: 33 mg/dL — ABNORMAL LOW (ref >39)
LDL Chol Calc (NIH): 134 mg/dL — ABNORMAL HIGH (ref 0–99)
Triglycerides: 159 mg/dL — ABNORMAL HIGH (ref 0–149)
VLDL Cholesterol Cal: 29 mg/dL (ref 5–40)

## 2022-05-02 LAB — CARDIOVASCULAR RISK ASSESSMENT

## 2022-05-02 LAB — HCV INTERPRETATION

## 2022-05-02 LAB — TSH: TSH: 1.65 u[IU]/mL (ref 0.450–4.500)

## 2022-05-02 LAB — ACUTE VIRAL HEPATITIS (HAV, HBV, HCV)
HCV Ab: NONREACTIVE
Hep A IgM: NEGATIVE
Hep B C IgM: NEGATIVE
Hepatitis B Surface Ag: NEGATIVE

## 2022-05-02 NOTE — Progress Notes (Signed)
HBA1C: NEEDS ADDED ON Blood count normal.  Liver function abnormal. Elevated. Avoid alcohol and tylenol. Recheck CMP in 2 weeks.  Kidney function normal.  Thyroid function normal.  Cholesterol: LDL 134. Trigs elevated to 159. HDL low at 33. Recommend low fat diet and exercise.  HIV AND HEPATITIS NEGATIVE. PAP PENDING. GC/CH PENDING.

## 2022-05-03 ENCOUNTER — Other Ambulatory Visit: Payer: Self-pay

## 2022-05-03 DIAGNOSIS — R7989 Other specified abnormal findings of blood chemistry: Secondary | ICD-10-CM

## 2022-05-06 DIAGNOSIS — Z114 Encounter for screening for human immunodeficiency virus [HIV]: Secondary | ICD-10-CM | POA: Insufficient documentation

## 2022-05-06 DIAGNOSIS — Z3009 Encounter for other general counseling and advice on contraception: Secondary | ICD-10-CM | POA: Insufficient documentation

## 2022-05-06 DIAGNOSIS — Z124 Encounter for screening for malignant neoplasm of cervix: Secondary | ICD-10-CM | POA: Insufficient documentation

## 2022-05-06 DIAGNOSIS — Z0001 Encounter for general adult medical examination with abnormal findings: Secondary | ICD-10-CM | POA: Insufficient documentation

## 2022-05-06 DIAGNOSIS — Z113 Encounter for screening for infections with a predominantly sexual mode of transmission: Secondary | ICD-10-CM | POA: Insufficient documentation

## 2022-05-06 LAB — GC/CHLAMYDIA PROBE AMP
Chlamydia trachomatis, NAA: NEGATIVE
Neisseria Gonorrhoeae by PCR: NEGATIVE

## 2022-05-06 NOTE — Assessment & Plan Note (Signed)
Checked urine pregnancy test. [facility].

## 2022-05-06 NOTE — Assessment & Plan Note (Signed)
Things to do to keep yourself healthy  - Exercise at least 30-45 minutes a day, 3-4 days a week.  - Eat a low-fat diet with lots of fruits and vegetables, up to 7-9 servings per day.  - Seatbelts can save your life. Wear them always.  - Smoke detectors on every level of your home, check batteries every year.  - Eye Doctor - have an eye exam every 1-2 years  - Dentist twice a year - Safe sex - if you may be exposed to STDs, use a condom.  - Alcohol -  If you drink, do it moderately, less than 2 drinks per day.  - Health Care Power of Attorney. Choose someone to speak for you if you are not able.  - Depression is common in our stressful world.If you're feeling down or losing interest in things you normally enjoy, please come in for a visit.  - Violence - If anyone is threatening or hurting you, please call immediately.  

## 2022-05-07 LAB — IGP, APTIMA HPV, RFX 16/18,45
HPV Aptima: NEGATIVE
PAP Smear Comment: 0

## 2022-05-09 LAB — HEMOGLOBIN A1C
Est. average glucose Bld gHb Est-mCnc: 143 mg/dL
Hgb A1c MFr Bld: 6.6 % — ABNORMAL HIGH (ref 4.8–5.6)

## 2022-05-09 LAB — SPECIMEN STATUS REPORT

## 2022-05-10 ENCOUNTER — Other Ambulatory Visit: Payer: Self-pay

## 2022-05-10 ENCOUNTER — Telehealth: Payer: Self-pay

## 2022-05-10 MED ORDER — OZEMPIC (0.25 OR 0.5 MG/DOSE) 2 MG/3ML ~~LOC~~ SOPN: 0.2500 mg | PEN_INJECTOR | SUBCUTANEOUS | 2 refills | Status: DC

## 2022-05-10 MED ORDER — ROSUVASTATIN CALCIUM 10 MG PO TABS: 10.0000 mg | ORAL_TABLET | Freq: Every day | ORAL | 2 refills | Status: DC

## 2022-05-10 NOTE — Telephone Encounter (Signed)
PA submitted and approved via covermymeds for ozempic 0.25 mg.

## 2022-05-15 ENCOUNTER — Other Ambulatory Visit: Payer: Self-pay

## 2022-05-15 MED ORDER — OZEMPIC (0.25 OR 0.5 MG/DOSE) 2 MG/3ML ~~LOC~~ SOPN: 0.2500 mg | PEN_INJECTOR | SUBCUTANEOUS | 2 refills | Status: DC

## 2022-05-17 ENCOUNTER — Other Ambulatory Visit: Payer: Self-pay

## 2022-05-17 ENCOUNTER — Other Ambulatory Visit: Payer: Commercial Managed Care - PPO

## 2022-05-17 DIAGNOSIS — R7989 Other specified abnormal findings of blood chemistry: Secondary | ICD-10-CM

## 2022-05-17 LAB — COMPREHENSIVE METABOLIC PANEL
ALT: 104 IU/L — ABNORMAL HIGH (ref 0–32)
AST: 39 IU/L (ref 0–40)
Albumin/Globulin Ratio: 1.4 (ref 1.2–2.2)
Albumin: 4.2 g/dL (ref 3.9–4.9)
Alkaline Phosphatase: 78 IU/L (ref 44–121)
BUN/Creatinine Ratio: 11 (ref 9–23)
BUN: 10 mg/dL (ref 6–20)
Bilirubin Total: 0.4 mg/dL (ref 0.0–1.2)
CO2: 22 mmol/L (ref 20–29)
Calcium: 9.4 mg/dL (ref 8.7–10.2)
Chloride: 100 mmol/L (ref 96–106)
Creatinine, Ser: 0.9 mg/dL (ref 0.57–1.00)
Globulin, Total: 2.9 g/dL (ref 1.5–4.5)
Glucose: 180 mg/dL — ABNORMAL HIGH (ref 70–99)
Potassium: 4.6 mmol/L (ref 3.5–5.2)
Sodium: 137 mmol/L (ref 134–144)
Total Protein: 7.1 g/dL (ref 6.0–8.5)
eGFR: 87 mL/min/{1.73_m2} (ref >59)

## 2022-05-17 MED ORDER — OZEMPIC (0.25 OR 0.5 MG/DOSE) 2 MG/3ML ~~LOC~~ SOPN: 0.5000 mg | PEN_INJECTOR | SUBCUTANEOUS | 0 refills | Status: DC

## 2022-05-19 ENCOUNTER — Other Ambulatory Visit: Payer: Self-pay

## 2022-05-19 DIAGNOSIS — F902 Attention-deficit hyperactivity disorder, combined type: Secondary | ICD-10-CM

## 2022-05-19 MED ORDER — AMPHETAMINE-DEXTROAMPHETAMINE 10 MG PO TABS: 10.0000 mg | ORAL_TABLET | Freq: Every day | ORAL | 0 refills | Status: DC

## 2022-05-22 ENCOUNTER — Other Ambulatory Visit: Payer: Self-pay

## 2022-05-22 DIAGNOSIS — R7989 Other specified abnormal findings of blood chemistry: Secondary | ICD-10-CM

## 2022-05-23 ENCOUNTER — Other Ambulatory Visit: Payer: Self-pay | Admitting: Family Medicine

## 2022-05-26 ENCOUNTER — Telehealth: Payer: Self-pay

## 2022-05-26 NOTE — Telephone Encounter (Signed)
Patient called and stated that the last time she was here for her appointment she was battling a sinus/upper respiratory infection. She is concerned that not the congestion is settling in her ear causing a infection in her ear. She is calling requesting something be sent in to the pharmacy. I insisted that patient go to Urgent care and or do my chart e visit and patient stated she could not do those and she still insisted that we send message and ask anyway. Patient states she uses prevo drug.

## 2022-05-29 NOTE — Telephone Encounter (Signed)
Called and offered patient a appt for today however she declined stating that if she felt she needed one she would cal back in a day or so to schedule one.

## 2022-06-02 ENCOUNTER — Other Ambulatory Visit: Payer: Self-pay

## 2022-06-02 DIAGNOSIS — R7989 Other specified abnormal findings of blood chemistry: Secondary | ICD-10-CM

## 2022-06-19 ENCOUNTER — Other Ambulatory Visit: Payer: Self-pay

## 2022-06-19 ENCOUNTER — Telehealth: Payer: Self-pay

## 2022-06-19 DIAGNOSIS — R1011 Right upper quadrant pain: Secondary | ICD-10-CM

## 2022-06-19 DIAGNOSIS — K8020 Calculus of gallbladder without cholecystitis without obstruction: Secondary | ICD-10-CM

## 2022-06-19 DIAGNOSIS — B009 Herpesviral infection, unspecified: Secondary | ICD-10-CM

## 2022-06-19 MED ORDER — VALACYCLOVIR HCL 500 MG PO TABS: 500.0000 mg | ORAL_TABLET | Freq: Every day | ORAL | 6 refills | Status: DC

## 2022-06-19 NOTE — Telephone Encounter (Signed)
Patient called stating that she has been experiencing RUQ pain along with belching and aching, and recently had a RUQ ultrasound which showed gall stone in the gallbladder. She states she would like a referral to somewhere local for general surgery. Putting in referral to Dr. Lequita Halt.

## 2022-06-23 HISTORY — PX: CHOLECYSTECTOMY, LAPAROSCOPIC: SHX56

## 2022-07-27 ENCOUNTER — Other Ambulatory Visit: Payer: Self-pay | Admitting: Family Medicine

## 2022-07-27 ENCOUNTER — Other Ambulatory Visit: Payer: Self-pay

## 2022-09-08 ENCOUNTER — Other Ambulatory Visit: Payer: Self-pay

## 2022-09-08 DIAGNOSIS — F902 Attention-deficit hyperactivity disorder, combined type: Secondary | ICD-10-CM

## 2022-09-08 MED ORDER — AMPHETAMINE-DEXTROAMPHETAMINE 10 MG PO TABS: 10.0000 mg | ORAL_TABLET | Freq: Every day | ORAL | 0 refills | Status: DC

## 2022-10-06 ENCOUNTER — Telehealth: Payer: Self-pay

## 2022-10-06 NOTE — Telephone Encounter (Signed)
Patient called and left message stated she out of her Ozempic and wanted to know if she could get a sample until she get her medication since it takes a week for her mail order.  Patient states she has been on 0.5 mg for 3 months and is tolerating it fine, she does feel that she needs to go up on the dose.   Patient has an appointment on 10/12/22 with Langley Gauss, PA-C for fasting and I have put a sample in the frig for her to get at her appointment

## 2022-10-12 ENCOUNTER — Ambulatory Visit: Payer: Commercial Managed Care - PPO | Admitting: Physician Assistant

## 2022-10-18 NOTE — Progress Notes (Unsigned)
Subjective:  Patient ID: Anita Peters, female    DOB: February 03, 1991  Age: 32 y.o. MRN: [id number]  Chief Complaint  Patient presents with   ADHD    HPI Diabetes type 2: Currently on Ozempic .5mg  has not taken it in 3 weeks. States she did have some nausea symptoms at the start, but she mostly had issues over eating while on the medicine. This then caused her to have the nausea because she ate so much.    ADD: Patient is taking Adderall 10 mg daily.   GAD: She is taking escitalopram 20 mg daily. Doing well on medicine.  States that she has some breakthrough depressive episode due to her uncle recently passed away in 2022/06/11.  States that she is unsure if it is more so grieving or if it is depression.  States that the medicine has not been helping her with the symptoms and that they have been getting worse.  Denies any suicidal or homicidal ideation.  States she mostly just has fatigue and anhedonia.  GERD: Taking Pantoprazole 40 mg daily. She is stable on medicines     10/19/2022   10:16 AM 05/01/2022    2:45 PM 09/09/2020   12:09 PM 09/09/2020    9:54 AM 07/02/2019   11:13 AM  Depression screen PHQ 2/9  Decreased Interest 1 0 1 0 1  Down, Depressed, Hopeless 1 0 0 0 1  PHQ - 2 Score 2 0 1 0 2  Altered sleeping 1  1  2   Tired, decreased energy 1  2  2   Change in appetite 1  1  1   Feeling bad or failure about yourself  0  1  0  Trouble concentrating 0  1  2  Moving slowly or fidgety/restless 0  0  1  Suicidal thoughts 0  0  0  PHQ-9 Score 5  7  10   Difficult doing work/chores Not difficult at all  Somewhat difficult          10/19/2022   10:16 AM  Fall Risk   Falls in the past year? 0  Number falls in past yr: 0  Injury with Fall? 0  Risk for fall due to : No Fall Risks  Follow up Falls prevention discussed    Patient Care Team: Blane Ohara, MD as PCP - General (Internal Medicine) Blane Ohara, MD as Referring Physician (Family Medicine)   Review of Systems  Constitutional:   Negative for chills, fatigue and fever.  HENT:  Negative for congestion, ear pain and sore throat.   Respiratory:  Negative for cough and shortness of breath.   Cardiovascular:  Negative for chest pain and palpitations.  Gastrointestinal:  Positive for diarrhea (IBS) and nausea (medication side effect). Negative for abdominal pain, constipation and vomiting.  Endocrine: Negative for polydipsia, polyphagia and polyuria.  Genitourinary:  Negative for difficulty urinating and dysuria.  Musculoskeletal:  Negative for arthralgias, back pain and myalgias.  Skin:  Negative for rash.  Neurological:  Negative for headaches.  Psychiatric/Behavioral:  Negative for dysphoric mood. The patient is not nervous/anxious.     Current Outpatient Medications on File Prior to Visit  Medication Sig Dispense Refill   amphetamine-dextroamphetamine (ADDERALL) 10 MG tablet Take 1 tablet (10 mg total) by mouth daily with breakfast. 30 tablet 0   escitalopram (LEXAPRO) 20 MG tablet Take 1 tablet (20 mg total) by mouth daily. 30 tablet 6   norethindrone-ethinyl estradiol-iron (JUNEL FE 1.5/30) 1.5-30 MG-MCG tablet Take 1 tablet by  mouth daily. 84 tablet 3   pantoprazole (PROTONIX) 40 MG tablet Take 1 tablet (40 mg total) by mouth daily. 30 tablet 6   rosuvastatin (CRESTOR) 10 MG tablet Take 1 tablet (10 mg total) by mouth daily. 30 tablet 2   valACYclovir (VALTREX) 500 MG tablet Take 1 tablet (500 mg total) by mouth daily. 30 tablet 6   No current facility-administered medications on file prior to visit.   Past Medical History:  Diagnosis Date   Antepartum mild preeclampsia 11/14/2016   Formatting of this note might be different from the original. Seen for repeat BP check in triage 12/04/16 Rescue BMZ 10/8 - 10/9  HELLP labs normal/stable 12/16/16, except urine P/C 10.7 Dr. Cyndie Chime spoke with Dr. Particia Nearing (MFM) on 12/18/16 regarding patients clinical care with severe BP on 10/19 (though not 4h apart) and worsening  proteinuria with urine P/C of 10.7 (previously 0.3 in August).    Migraine without aura and without status migrainosus, not intractable 07/02/2019   Pregnancy induced hypertension    Rh negative state in antepartum period 06/09/2016   Short cervix affecting pregnancy 09/26/2016   Past Surgical History:  Procedure Laterality Date   CESAREAN SECTION  12/20/2016   CHOLECYSTECTOMY, LAPAROSCOPIC  06/23/2022   Dr Lequita Halt   DILATION AND CURETTAGE OF UTERUS     WISDOM TOOTH EXTRACTION      Family History  Problem Relation Age of Onset   Hyperlipidemia Mother    Diabetes Mother    Diabetes Maternal Uncle    Diabetes Maternal Grandmother    Social History   Socioeconomic History   Marital status: Single    Spouse name: Not on file   Number of children: 2   Years of education: Not on file   Highest education level: Not on file  Occupational History   Occupation: CNA  Tobacco Use   Smoking status: Never   Smokeless tobacco: Never  Vaping Use   Vaping status: Never Used  Substance and Sexual Activity   Alcohol use: Not Currently    Alcohol/week: 2.0 standard drinks of alcohol    Types: 2 Shots of liquor per week    Comment: Every 6 months   Drug use: No   Sexual activity: Not Currently    Birth control/protection: None  Other Topics Concern   Not on file  Social History Narrative   Not on file   Social Determinants of Health   Financial Resource Strain: Low Risk  (05/01/2022)   Overall Financial Resource Strain (CARDIA)    Difficulty of Paying Living Expenses: Not hard at all  Food Insecurity: No Food Insecurity (05/01/2022)   Hunger Vital Sign    Worried About Running Out of Food in the Last Year: Never true    Ran Out of Food in the Last Year: Never true  Transportation Needs: No Transportation Needs (05/01/2022)   PRAPARE - Administrator, Civil Service (Medical): No    Lack of Transportation (Non-Medical): No  Physical Activity: Inactive (05/01/2022)   Exercise  Vital Sign    Days of Exercise per Week: 0 days    Minutes of Exercise per Session: 0 min  Stress: No Stress Concern Present (05/01/2022)   [employer] of Occupational Health - Occupational Stress Questionnaire    Feeling of Stress : Not at all  Social Connections: Socially Isolated (05/01/2022)   Social Connection and Isolation Panel [NHANES]    Frequency of Communication with Friends and Family: Three times a week  Frequency of Social Gatherings with Friends and Family: Three times a week    Attends Religious Services: Never    Active Member of Clubs or Organizations: No    Attends Banker Meetings: Never    Marital Status: Never married    Objective:  BP 110/70   Pulse 94   Temp 98.5 F (36.9 C)   Resp 14   Ht 5\' 3"  (1.6 m)   Wt 239 lb (108.4 kg)   LMP 09/20/2022 (Approximate)   SpO2 98%   Breastfeeding No   BMI 42.34 kg/m      10/19/2022    9:57 AM 05/01/2022    2:42 PM 11/16/2021    3:46 PM  BP/Weight  Systolic BP 110 116 124  Diastolic BP 70 64 64  Wt. (Lbs) 239 243 234  BMI 42.34 kg/m2 43.05 kg/m2 41.45 kg/m2    Physical Exam Vitals reviewed.  Constitutional:      Appearance: Normal appearance.  Cardiovascular:     Rate and Rhythm: Normal rate and regular rhythm.     Heart sounds: Normal heart sounds.  Pulmonary:     Effort: Pulmonary effort is normal.     Breath sounds: Normal breath sounds.  Abdominal:     General: Bowel sounds are normal.     Palpations: Abdomen is soft.     Tenderness: There is no abdominal tenderness.  Neurological:     Mental Status: She is alert and oriented to person, place, and time.  Psychiatric:        Mood and Affect: Mood normal.        Behavior: Behavior normal.     Diabetic Foot Exam - Simple   No data filed      Lab Results  Component Value Date   WBC 9.0 05/01/2022   HGB 13.2 05/01/2022   HCT 41.3 05/01/2022   PLT 327 05/01/2022   GLUCOSE 180 (H) 05/17/2022   CHOL 196 05/01/2022   TRIG  159 (H) 05/01/2022   HDL 33 (L) 05/01/2022   LDLCALC 134 (H) 05/01/2022   ALT 104 (H) 05/17/2022   AST 39 05/17/2022   NA 137 05/17/2022   K 4.6 05/17/2022   CL 100 05/17/2022   CREATININE 0.90 05/17/2022   BUN 10 05/17/2022   CO2 22 05/17/2022   TSH 1.650 05/01/2022   HGBA1C 6.6 (H) 05/01/2022      Assessment & Plan:    Controlled type 2 diabetes mellitus with complication, without long-term current use of insulin (HCC) Assessment & Plan: Lab Results  Component Value Date   HGBA1C 6.6 (H) 05/01/2022   Uncontrolled Labs drawn today Will adjust medication as needed based on labs Side effects were nausea with eating large meals Discussed eating smaller meals more frequently throughout the day Continue taking Ozempic .5mg  for 4 weeks then increase to 1mg  if tolerating it.  If still having issues take .5mg  instead of 1mg  for another month.    Orders: -     Hemoglobin A1c -     Microalbumin / creatinine urine ratio -     Ozempic (0.25 or 0.5 MG/DOSE); Inject 0.5 mg into the skin once a week for 28 days, THEN 1 mg once a week for 28 days, THEN 1 mg once a week for 28 days.  Dispense: 3 mL; Refill: 3  Attention deficit hyperactivity disorder, combined type Assessment & Plan: Controlled Continue taking Adderall 10 mg. Will monitor symptoms and let us know if we need to  make any adjustments.  Orders: -     VITAMIN D 25 Hydroxy (Vit-D Deficiency, Fractures)  Generalized anxiety disorder Assessment & Plan: Controlled Continue taking the lexapro 20mg  as prescribed Will continue to monitor symptoms   Gastroesophageal reflux disease without esophagitis Assessment & Plan: Controlled Denies any major side effects or issues with medication Continue taking pantoprazole 40 mg daily.  Orders: -     CBC with Differential/Platelet  Mixed hyperlipidemia Assessment & Plan: Uncontrolled No major side effects or issues taking the Crestor 10 mg Labs drawn today  Will adjust  medication as needed depending on labs  Orders: -     CBC with Differential/Platelet -     Comprehensive metabolic panel -     Lipid panel -     Ozempic (0.25 or 0.5 MG/DOSE); Inject 0.5 mg into the skin once a week for 28 days, THEN 1 mg once a week for 28 days, THEN 1 mg once a week for 28 days.  Dispense: 3 mL; Refill: 3  Fatty liver disease, nonalcoholic Assessment & Plan: Restart the Ozempic .5mg  and increase dose as tolerated to 1mg  Labs drawn today.  Orders: -     Ozempic (0.25 or 0.5 MG/DOSE); Inject 0.5 mg into the skin once a week for 28 days, THEN 1 mg once a week for 28 days, THEN 1 mg once a week for 28 days.  Dispense: 3 mL; Refill: 3  Moderate episode of recurrent major depressive disorder [facility]) Assessment & Plan: Uncontrolled Having worsening depressive symptoms due to losing her uncle in March. Has not felt like the Lexapro has been helping. Prescribed Vraylar 1.5 mg Will let us know in 2 weeks it is effective   Orders: -     Cariprazine HCl; Take 1 capsule (1.5 mg total) by mouth daily.  Dispense: 30 capsule; Refill: 0     Meds ordered this encounter  Medications   cariprazine (VRAYLAR) 1.5 MG capsule    Sig: Take 1 capsule (1.5 mg total) by mouth daily.    Dispense:  30 capsule    Refill:  0   Semaglutide,0.25 or 0.5MG /DOS, (OZEMPIC, 0.25 OR 0.5 MG/DOSE,) 2 MG/3ML SOPN    Sig: Inject 0.5 mg into the skin once a week for 28 days, THEN 1 mg once a week for 28 days, THEN 1 mg once a week for 28 days.    Dispense:  3 mL    Refill:  3    Orders Placed This Encounter  Procedures   CBC with Differential/Platelet   Comprehensive metabolic panel   Hemoglobin A1c   Lipid panel   Microalbumin / creatinine urine ratio   VITAMIN D 25 Hydroxy (Vit-D Deficiency, Fractures)     Follow-up: Return in about 3 months (around 01/19/2023) for Chronic, fasting.   I,Marla I Leal-Borjas,acting as a scribe for [employer], PA.,have documented all relevant documentation  on the behalf of Langley Gauss, PA,as directed by  Langley Gauss, PA while in the presence of Langley Gauss, [place].   An After Visit Summary was printed and given to the patient.  Langley Gauss, [place] [facility] [phone]

## 2022-10-19 ENCOUNTER — Ambulatory Visit (INDEPENDENT_AMBULATORY_CARE_PROVIDER_SITE_OTHER): Payer: Commercial Managed Care - PPO | Admitting: Physician Assistant

## 2022-10-19 ENCOUNTER — Encounter: Payer: Self-pay | Admitting: Physician Assistant

## 2022-10-19 VITALS — BP 110/70 | HR 94 | Temp 98.5°F | Resp 14 | Ht 63.0 in | Wt 239.0 lb

## 2022-10-19 DIAGNOSIS — E118 Type 2 diabetes mellitus with unspecified complications: Secondary | ICD-10-CM

## 2022-10-19 DIAGNOSIS — E1165 Type 2 diabetes mellitus with hyperglycemia: Secondary | ICD-10-CM | POA: Insufficient documentation

## 2022-10-19 DIAGNOSIS — F331 Major depressive disorder, recurrent, moderate: Secondary | ICD-10-CM | POA: Insufficient documentation

## 2022-10-19 DIAGNOSIS — K7581 Nonalcoholic steatohepatitis (NASH): Secondary | ICD-10-CM | POA: Insufficient documentation

## 2022-10-19 DIAGNOSIS — F411 Generalized anxiety disorder: Secondary | ICD-10-CM | POA: Diagnosis not present

## 2022-10-19 DIAGNOSIS — F902 Attention-deficit hyperactivity disorder, combined type: Secondary | ICD-10-CM | POA: Diagnosis not present

## 2022-10-19 DIAGNOSIS — K219 Gastro-esophageal reflux disease without esophagitis: Secondary | ICD-10-CM

## 2022-10-19 DIAGNOSIS — E782 Mixed hyperlipidemia: Secondary | ICD-10-CM

## 2022-10-19 DIAGNOSIS — K760 Fatty (change of) liver, not elsewhere classified: Secondary | ICD-10-CM

## 2022-10-19 MED ORDER — OZEMPIC (0.25 OR 0.5 MG/DOSE) 2 MG/3ML ~~LOC~~ SOPN
PEN_INJECTOR | SUBCUTANEOUS | 3 refills | Status: DC
Start: 2022-10-19 — End: 2022-10-24

## 2022-10-19 MED ORDER — CARIPRAZINE HCL 1.5 MG PO CAPS
1.5000 mg | ORAL_CAPSULE | Freq: Every day | ORAL | 0 refills | Status: DC
Start: 2022-10-19 — End: 2023-05-22

## 2022-10-19 NOTE — Assessment & Plan Note (Signed)
Lab Results  Component Value Date   HGBA1C 6.6 (H) 05/01/2022   Uncontrolled Labs drawn today Will adjust medication as needed based on labs Side effects were nausea with eating large meals Discussed eating smaller meals more frequently throughout the day Continue taking Ozempic .5mg  for 4 weeks then increase to 1mg  if tolerating it.  If still having issues take .5mg  instead of 1mg  for another month.

## 2022-10-19 NOTE — Assessment & Plan Note (Signed)
Uncontrolled Having worsening depressive symptoms due to losing her uncle in March. Has not felt like the Lexapro has been helping. Prescribed Vraylar 1.5 mg Will let us know in 2 weeks it is effective

## 2022-10-19 NOTE — Assessment & Plan Note (Signed)
Controlled Continue taking Adderall 10 mg. Will monitor symptoms and let us know if we need to make any adjustments.

## 2022-10-19 NOTE — Assessment & Plan Note (Signed)
Controlled Denies any major side effects or issues with medication Continue taking pantoprazole 40 mg daily.

## 2022-10-19 NOTE — Assessment & Plan Note (Signed)
Controlled Continue taking the lexapro 20mg  as prescribed Will continue to monitor symptoms

## 2022-10-19 NOTE — Assessment & Plan Note (Addendum)
Restart the Ozempic .5mg  and increase dose as tolerated to 1mg  Labs drawn today.

## 2022-10-19 NOTE — Assessment & Plan Note (Signed)
Uncontrolled No major side effects or issues taking the Crestor 10 mg Labs drawn today  Will adjust medication as needed depending on labs

## 2022-10-20 LAB — MICROALBUMIN / CREATININE URINE RATIO
Creatinine, Urine: 254.6 mg/dL
Microalb/Creat Ratio: 26 mg/g{creat} (ref 0–29)
Microalbumin, Urine: 67.1 ug/mL

## 2022-10-20 LAB — COMPREHENSIVE METABOLIC PANEL
ALT: 136 IU/L — ABNORMAL HIGH (ref 0–32)
AST: 122 IU/L — ABNORMAL HIGH (ref 0–40)
Albumin: 4.2 g/dL (ref 3.9–4.9)
Alkaline Phosphatase: 79 IU/L (ref 44–121)
BUN/Creatinine Ratio: 15 (ref 9–23)
BUN: 14 mg/dL (ref 6–20)
Bilirubin Total: 0.2 mg/dL (ref 0.0–1.2)
CO2: 24 mmol/L (ref 20–29)
Calcium: 10 mg/dL (ref 8.7–10.2)
Chloride: 99 mmol/L (ref 96–106)
Creatinine, Ser: 0.91 mg/dL (ref 0.57–1.00)
Globulin, Total: 3.2 g/dL (ref 1.5–4.5)
Glucose: 196 mg/dL — ABNORMAL HIGH (ref 70–99)
Potassium: 4.6 mmol/L (ref 3.5–5.2)
Sodium: 137 mmol/L (ref 134–144)
Total Protein: 7.4 g/dL (ref 6.0–8.5)
eGFR: 86 mL/min/{1.73_m2} (ref >59)

## 2022-10-20 LAB — HEMOGLOBIN A1C
Est. average glucose Bld gHb Est-mCnc: 203 mg/dL
Hgb A1c MFr Bld: 8.7 % — ABNORMAL HIGH (ref 4.8–5.6)

## 2022-10-20 LAB — CBC WITH DIFFERENTIAL/PLATELET
Basophils Absolute: 0 10*3/uL (ref 0.0–0.2)
Basos: 0 %
EOS (ABSOLUTE): 0.2 10*3/uL (ref 0.0–0.4)
Eos: 2 %
Hematocrit: 37.6 % (ref 34.0–46.6)
Hemoglobin: 12.5 g/dL (ref 11.1–15.9)
Immature Grans (Abs): 0 10*3/uL (ref 0.0–0.1)
Immature Granulocytes: 0 %
Lymphocytes Absolute: 3.9 10*3/uL — ABNORMAL HIGH (ref 0.7–3.1)
Lymphs: 44 %
MCH: 26.5 pg — ABNORMAL LOW (ref 26.6–33.0)
MCHC: 33.2 g/dL (ref 31.5–35.7)
MCV: 80 fL (ref 79–97)
Monocytes Absolute: 0.7 10*3/uL (ref 0.1–0.9)
Monocytes: 8 %
Neutrophils Absolute: 4.1 10*3/uL (ref 1.4–7.0)
Neutrophils: 46 %
Platelets: 310 10*3/uL (ref 150–450)
RBC: 4.72 x10E6/uL (ref 3.77–5.28)
RDW: 13.6 % (ref 11.7–15.4)
WBC: 8.9 10*3/uL (ref 3.4–10.8)

## 2022-10-20 LAB — VITAMIN D 25 HYDROXY (VIT D DEFICIENCY, FRACTURES): Vit D, 25-Hydroxy: 26 ng/mL — ABNORMAL LOW (ref 30.0–100.0)

## 2022-10-20 LAB — LIPID PANEL
Chol/HDL Ratio: 7 ratio — ABNORMAL HIGH (ref 0.0–4.4)
Cholesterol, Total: 217 mg/dL — ABNORMAL HIGH (ref 100–199)
HDL: 31 mg/dL — ABNORMAL LOW (ref >39)
LDL Chol Calc (NIH): 135 mg/dL — ABNORMAL HIGH (ref 0–99)
Triglycerides: 281 mg/dL — ABNORMAL HIGH (ref 0–149)
VLDL Cholesterol Cal: 51 mg/dL — ABNORMAL HIGH (ref 5–40)

## 2022-10-24 ENCOUNTER — Other Ambulatory Visit: Payer: Self-pay | Admitting: Physician Assistant

## 2022-10-24 ENCOUNTER — Other Ambulatory Visit: Payer: Self-pay

## 2022-10-24 DIAGNOSIS — E782 Mixed hyperlipidemia: Secondary | ICD-10-CM

## 2022-10-24 DIAGNOSIS — R7989 Other specified abnormal findings of blood chemistry: Secondary | ICD-10-CM

## 2022-10-24 DIAGNOSIS — K760 Fatty (change of) liver, not elsewhere classified: Secondary | ICD-10-CM

## 2022-10-24 DIAGNOSIS — E118 Type 2 diabetes mellitus with unspecified complications: Secondary | ICD-10-CM

## 2022-10-24 DIAGNOSIS — E559 Vitamin D deficiency, unspecified: Secondary | ICD-10-CM

## 2022-10-24 MED ORDER — VITAMIN D (ERGOCALCIFEROL) 1.25 MG (50000 UNIT) PO CAPS
50000.0000 [IU] | ORAL_CAPSULE | ORAL | 0 refills | Status: DC
Start: 2022-10-24 — End: 2023-02-19

## 2022-10-24 MED ORDER — OZEMPIC (0.25 OR 0.5 MG/DOSE) 2 MG/3ML ~~LOC~~ SOPN: PEN_INJECTOR | SUBCUTANEOUS | 3 refills | Status: DC

## 2022-10-27 ENCOUNTER — Other Ambulatory Visit: Payer: Self-pay

## 2022-10-27 DIAGNOSIS — E118 Type 2 diabetes mellitus with unspecified complications: Secondary | ICD-10-CM

## 2022-10-27 DIAGNOSIS — E782 Mixed hyperlipidemia: Secondary | ICD-10-CM

## 2022-10-27 DIAGNOSIS — K760 Fatty (change of) liver, not elsewhere classified: Secondary | ICD-10-CM

## 2022-10-27 MED ORDER — OZEMPIC (0.25 OR 0.5 MG/DOSE) 2 MG/3ML ~~LOC~~ SOPN: 0.5000 mg | PEN_INJECTOR | SUBCUTANEOUS | 0 refills | Status: AC

## 2022-11-21 ENCOUNTER — Telehealth: Payer: Self-pay

## 2022-11-21 NOTE — Telephone Encounter (Signed)
-----   Message from Langley Gauss sent at 11/07/2022  4:19 PM EDT ----- Regarding: RE: Nurse visit When it is most convenient for her. With her levels elevated I wanted to see if we could figure out if anything in particular is leading to her elevated liver numbers ----- Message ----- From: Vernard Gambles, CMA Sent: 11/07/2022   3:30 PM EDT To: Langley Gauss, [place]; [facility] Subject: RE: Nurse visit                                Huston Foley,   When do you want this pt to come back for the blood work. I called her today but was unsure about the timeframe of when she needs to come back for labs.  Please advise. ----- Message ----- From: Langley Gauss, PA Sent: 11/02/2022  11:42 AM EDT To: [facility] Subject: Nurse visit                                    Can we call and schedule a nurse visit for her to get some lab work done. The nurses called to let her know we want more blood work, but I don't see if she scheduled anything.

## 2022-11-21 NOTE — Telephone Encounter (Signed)
I tried to contact the patient to make an appointment but was unable to leave a message. I have mailed the patient a letter, requesting her to call the office.

## 2022-11-23 ENCOUNTER — Other Ambulatory Visit: Payer: Self-pay

## 2022-11-23 DIAGNOSIS — F411 Generalized anxiety disorder: Secondary | ICD-10-CM

## 2022-11-24 MED ORDER — ESCITALOPRAM OXALATE 20 MG PO TABS
20.0000 mg | ORAL_TABLET | Freq: Every day | ORAL | 2 refills | Status: DC
Start: 2022-11-24 — End: 2023-01-22

## 2022-11-30 ENCOUNTER — Other Ambulatory Visit: Payer: Self-pay

## 2022-11-30 DIAGNOSIS — K219 Gastro-esophageal reflux disease without esophagitis: Secondary | ICD-10-CM

## 2022-12-01 MED ORDER — PANTOPRAZOLE SODIUM 40 MG PO TBEC
40.0000 mg | DELAYED_RELEASE_TABLET | Freq: Every day | ORAL | 6 refills | Status: DC
Start: 2022-12-01 — End: 2023-09-10

## 2022-12-20 ENCOUNTER — Ambulatory Visit (INDEPENDENT_AMBULATORY_CARE_PROVIDER_SITE_OTHER): Payer: Commercial Managed Care - PPO

## 2022-12-20 DIAGNOSIS — Z23 Encounter for immunization: Secondary | ICD-10-CM

## 2022-12-27 ENCOUNTER — Other Ambulatory Visit: Payer: Self-pay

## 2022-12-29 ENCOUNTER — Telehealth: Payer: Self-pay | Admitting: Family Medicine

## 2022-12-29 MED ORDER — ONDANSETRON HCL 4 MG PO TABS: 4.0000 mg | ORAL_TABLET | Freq: Three times a day (TID) | ORAL | 0 refills | Status: AC | PRN

## 2022-12-29 NOTE — Telephone Encounter (Signed)
Error  -  not able to see what medicine the pt was wanting to refill on. Sent to a nurse line

## 2023-01-22 ENCOUNTER — Other Ambulatory Visit: Payer: Self-pay

## 2023-01-22 DIAGNOSIS — F411 Generalized anxiety disorder: Secondary | ICD-10-CM

## 2023-01-22 MED ORDER — ESCITALOPRAM OXALATE 20 MG PO TABS: 20.0000 mg | ORAL_TABLET | Freq: Every day | ORAL | 2 refills | Status: DC

## 2023-02-07 NOTE — Progress Notes (Unsigned)
Subjective:  Patient ID: Anita Peters, female    DOB: 1990-11-25  Age: 32 y.o. MRN: [id number]  Chief Complaint  Patient presents with   Medical Management of Chronic Issues    HPI   Diabetes type 2: Currently on Ozempic .5mg     ADD: Patient is taking Adderall 10 mg daily.   GAD: She is taking escitalopram 20 mg daily. Doing well on medicine.   GERD: Taking Pantoprazole 40 mg daily. She is stable on medicines     10/19/2022   10:16 AM 05/01/2022    2:45 PM 09/09/2020   12:09 PM 09/09/2020    9:54 AM 07/02/2019   11:13 AM  Depression screen PHQ 2/9  Decreased Interest 1 0 1 0 1  Down, Depressed, Hopeless 1 0 0 0 1  PHQ - 2 Score 2 0 1 0 2  Altered sleeping 1  1  2   Tired, decreased energy 1  2  2   Change in appetite 1  1  1   Feeling bad or failure about yourself  0  1  0  Trouble concentrating 0  1  2  Moving slowly or fidgety/restless 0  0  1  Suicidal thoughts 0  0  0  PHQ-9 Score 5  7  10   Difficult doing work/chores Not difficult at all  Somewhat difficult          10/19/2022   10:16 AM  Fall Risk   Falls in the past year? 0  Number falls in past yr: 0  Injury with Fall? 0  Risk for fall due to : No Fall Risks  Follow up Falls prevention discussed    Patient Care Team: Cox, Fritzi Mandes, MD as PCP - General (Internal Medicine) Cox, Kirsten, MD as Referring Physician (Family Medicine)   Review of Systems  Current Outpatient Medications on File Prior to Visit  Medication Sig Dispense Refill   amphetamine-dextroamphetamine (ADDERALL) 10 MG tablet Take 1 tablet (10 mg total) by mouth daily with breakfast. 30 tablet 0   cariprazine (VRAYLAR) 1.5 MG capsule Take 1 capsule (1.5 mg total) by mouth daily. 30 capsule 0   escitalopram (LEXAPRO) 20 MG tablet Take 1 tablet (20 mg total) by mouth daily. 30 tablet 2   norethindrone-ethinyl estradiol-iron (JUNEL FE 1.5/30) 1.5-30 MG-MCG tablet Take 1 tablet by mouth daily. 84 tablet 3   ondansetron (ZOFRAN) 4 MG tablet Take 1  tablet (4 mg total) by mouth every 8 (eight) hours as needed for nausea or vomiting. 40 tablet 0   pantoprazole (PROTONIX) 40 MG tablet Take 1 tablet (40 mg total) by mouth daily. 30 tablet 6   rosuvastatin (CRESTOR) 10 MG tablet Take 1 tablet (10 mg total) by mouth daily. 30 tablet 2   valACYclovir (VALTREX) 500 MG tablet Take 1 tablet (500 mg total) by mouth daily. 30 tablet 6   Vitamin D, Ergocalciferol, (DRISDOL) 1.25 MG (50000 UNIT) CAPS capsule Take 1 capsule (50,000 Units total) by mouth every 7 (seven) days. 5 capsule 0   No current facility-administered medications on file prior to visit.   Past Medical History:  Diagnosis Date   Antepartum mild preeclampsia 11/14/2016   Formatting of this note might be different from the original. Seen for repeat BP check in triage 12/04/16 Rescue BMZ 10/8 - 10/9  HELLP labs normal/stable 12/16/16, except urine P/C 10.7 Dr. Cyndie Chime spoke with Dr. Particia Nearing (MFM) on 12/18/16 regarding patients clinical care with severe BP on 10/19 (though not 4h apart) and  worsening proteinuria with urine P/C of 10.7 (previously 0.3 in August).    Migraine without aura and without status migrainosus, not intractable 07/02/2019   Pregnancy induced hypertension    Rh negative state in antepartum period 06/09/2016   Short cervix affecting pregnancy 09/26/2016   Past Surgical History:  Procedure Laterality Date   CESAREAN SECTION  12/20/2016   CHOLECYSTECTOMY, LAPAROSCOPIC  06/23/2022   Dr Lequita Halt   DILATION AND CURETTAGE OF UTERUS     WISDOM TOOTH EXTRACTION      Family History  Problem Relation Age of Onset   Hyperlipidemia Mother    Diabetes Mother    Diabetes Maternal Uncle    Diabetes Maternal Grandmother    Social History   Socioeconomic History   Marital status: Single    Spouse name: Not on file   Number of children: 2   Years of education: Not on file   Highest education level: Not on file  Occupational History   Occupation: CNA  Tobacco Use    Smoking status: Never   Smokeless tobacco: Never  Vaping Use   Vaping status: Never Used  Substance and Sexual Activity   Alcohol use: Not Currently    Alcohol/week: 2.0 standard drinks of alcohol    Types: 2 Shots of liquor per week    Comment: Every 6 months   Drug use: No   Sexual activity: Not Currently    Birth control/protection: None  Other Topics Concern   Not on file  Social History Narrative   Not on file   Social Determinants of Health   Financial Resource Strain: Low Risk  (05/01/2022)   Overall Financial Resource Strain (CARDIA)    Difficulty of Paying Living Expenses: Not hard at all  Food Insecurity: No Food Insecurity (05/01/2022)   Hunger Vital Sign    Worried About Running Out of Food in the Last Year: Never true    Ran Out of Food in the Last Year: Never true  Transportation Needs: No Transportation Needs (05/01/2022)   PRAPARE - Administrator, Civil Service (Medical): No    Lack of Transportation (Non-Medical): No  Physical Activity: Inactive (05/01/2022)   Exercise Vital Sign    Days of Exercise per Week: 0 days    Minutes of Exercise per Session: 0 min  Stress: No Stress Concern Present (05/01/2022)   [employer] of Occupational Health - Occupational Stress Questionnaire    Feeling of Stress : Not at all  Social Connections: Socially Isolated (05/01/2022)   Social Connection and Isolation Panel [NHANES]    Frequency of Communication with Friends and Family: Three times a week    Frequency of Social Gatherings with Friends and Family: Three times a week    Attends Religious Services: Never    Active Member of Clubs or Organizations: No    Attends Banker Meetings: Never    Marital Status: Never married    Objective:  There were no vitals taken for this visit.     10/19/2022    9:57 AM 05/01/2022    2:42 PM 11/16/2021    3:46 PM  BP/Weight  Systolic BP 110 116 124  Diastolic BP 70 64 64  Wt. (Lbs) 239 243 234  BMI 42.34  kg/m2 43.05 kg/m2 41.45 kg/m2    Physical Exam  Diabetic Foot Exam - Simple   No data filed      Lab Results  Component Value Date   WBC 8.9 10/19/2022   HGB  12.5 10/19/2022   HCT 37.6 10/19/2022   PLT 310 10/19/2022   GLUCOSE 196 (H) 10/19/2022   CHOL 217 (H) 10/19/2022   TRIG 281 (H) 10/19/2022   HDL 31 (L) 10/19/2022   LDLCALC 135 (H) 10/19/2022   ALT 136 (H) 10/19/2022   AST 122 (H) 10/19/2022   NA 137 10/19/2022   K 4.6 10/19/2022   CL 99 10/19/2022   CREATININE 0.91 10/19/2022   BUN 14 10/19/2022   CO2 24 10/19/2022   TSH 1.650 05/01/2022   HGBA1C 8.7 (H) 10/19/2022      Assessment & Plan:    There are no diagnoses linked to this encounter.   No orders of the defined types were placed in this encounter.   No orders of the defined types were placed in this encounter.    Follow-up: No follow-ups on file.   I,Jerrod Damiano I Leal-Borjas,acting as a scribe for Blane Ohara, MD.,have documented all relevant documentation on the behalf of Blane Ohara, MD,as directed by  Blane Ohara, MD while in the presence of Blane Ohara, MD.   An After Visit Summary was printed and given to the patient.  Blane Ohara, [place] [facility] [phone]

## 2023-02-08 ENCOUNTER — Encounter (INDEPENDENT_AMBULATORY_CARE_PROVIDER_SITE_OTHER): Payer: Commercial Managed Care - PPO | Admitting: Family Medicine

## 2023-02-08 DIAGNOSIS — E118 Type 2 diabetes mellitus with unspecified complications: Secondary | ICD-10-CM

## 2023-02-11 NOTE — Progress Notes (Signed)
Cancelled. Dr. Cox  

## 2023-02-13 NOTE — Progress Notes (Deleted)
Subjective:  Patient ID: Anita Peters, female    DOB: 1990-11-24  Age: 32 y.o. MRN: [id number]  Chief Complaint  Patient presents with   Medical Management of Chronic Issues    HPI   Diabetes type 2:   ADD: Patient is taking Adderall 10 mg daily.   GAD: She is taking escitalopram 20 mg daily. Doing well on medicine.    GERD: Taking Pantoprazole 40 mg daily. She is stable on medicines     10/19/2022   10:16 AM 05/01/2022    2:45 PM 09/09/2020   12:09 PM 09/09/2020    9:54 AM 07/02/2019   11:13 AM  Depression screen PHQ 2/9  Decreased Interest 1 0 1 0 1  Down, Depressed, Hopeless 1 0 0 0 1  PHQ - 2 Score 2 0 1 0 2  Altered sleeping 1  1  2   Tired, decreased energy 1  2  2   Change in appetite 1  1  1   Feeling bad or failure about yourself  0  1  0  Trouble concentrating 0  1  2  Moving slowly or fidgety/restless 0  0  1  Suicidal thoughts 0  0  0  PHQ-9 Score 5  7  10   Difficult doing work/chores Not difficult at all  Somewhat difficult          10/19/2022   10:16 AM  Fall Risk   Falls in the past year? 0  Number falls in past yr: 0  Injury with Fall? 0  Risk for fall due to : No Fall Risks  Follow up Falls prevention discussed    Patient Care Team: Cox, Fritzi Mandes, MD as PCP - General (Internal Medicine) Cox, Kirsten, MD as Referring Physician (Family Medicine)   Review of Systems  Current Outpatient Medications on File Prior to Visit  Medication Sig Dispense Refill   amphetamine-dextroamphetamine (ADDERALL) 10 MG tablet Take 1 tablet (10 mg total) by mouth daily with breakfast. 30 tablet 0   cariprazine (VRAYLAR) 1.5 MG capsule Take 1 capsule (1.5 mg total) by mouth daily. 30 capsule 0   escitalopram (LEXAPRO) 20 MG tablet Take 1 tablet (20 mg total) by mouth daily. 30 tablet 2   norethindrone-ethinyl estradiol-iron (JUNEL FE 1.5/30) 1.5-30 MG-MCG tablet Take 1 tablet by mouth daily. 84 tablet 3   ondansetron (ZOFRAN) 4 MG tablet Take 1 tablet (4 mg total) by mouth  every 8 (eight) hours as needed for nausea or vomiting. 40 tablet 0   pantoprazole (PROTONIX) 40 MG tablet Take 1 tablet (40 mg total) by mouth daily. 30 tablet 6   rosuvastatin (CRESTOR) 10 MG tablet Take 1 tablet (10 mg total) by mouth daily. 30 tablet 2   valACYclovir (VALTREX) 500 MG tablet Take 1 tablet (500 mg total) by mouth daily. 30 tablet 6   Vitamin D, Ergocalciferol, (DRISDOL) 1.25 MG (50000 UNIT) CAPS capsule Take 1 capsule (50,000 Units total) by mouth every 7 (seven) days. 5 capsule 0   No current facility-administered medications on file prior to visit.   Past Medical History:  Diagnosis Date   Antepartum mild preeclampsia 11/14/2016   Formatting of this note might be different from the original. Seen for repeat BP check in triage 12/04/16 Rescue BMZ 10/8 - 10/9  HELLP labs normal/stable 12/16/16, except urine P/C 10.7 Dr. Cyndie Chime spoke with Dr. Particia Nearing (MFM) on 12/18/16 regarding patients clinical care with severe BP on 10/19 (though not 4h apart) and worsening proteinuria with urine  P/C of 10.7 (previously 0.3 in August).    Migraine without aura and without status migrainosus, not intractable 07/02/2019   Pregnancy induced hypertension    Rh negative state in antepartum period 06/09/2016   Short cervix affecting pregnancy 09/26/2016   Past Surgical History:  Procedure Laterality Date   CESAREAN SECTION  12/20/2016   CHOLECYSTECTOMY, LAPAROSCOPIC  06/23/2022   Dr Lequita Halt   DILATION AND CURETTAGE OF UTERUS     WISDOM TOOTH EXTRACTION      Family History  Problem Relation Age of Onset   Hyperlipidemia Mother    Diabetes Mother    Diabetes Maternal Uncle    Diabetes Maternal Grandmother    Social History   Socioeconomic History   Marital status: Single    Spouse name: Not on file   Number of children: 2   Years of education: Not on file   Highest education level: Not on file  Occupational History   Occupation: CNA  Tobacco Use   Smoking status: Never    Smokeless tobacco: Never  Vaping Use   Vaping status: Never Used  Substance and Sexual Activity   Alcohol use: Not Currently    Alcohol/week: 2.0 standard drinks of alcohol    Types: 2 Shots of liquor per week    Comment: Every 6 months   Drug use: No   Sexual activity: Not Currently    Birth control/protection: None  Other Topics Concern   Not on file  Social History Narrative   Not on file   Social Drivers of Health   Financial Resource Strain: Low Risk  (05/01/2022)   Overall Financial Resource Strain (CARDIA)    Difficulty of Paying Living Expenses: Not hard at all  Food Insecurity: No Food Insecurity (05/01/2022)   Hunger Vital Sign    Worried About Running Out of Food in the Last Year: Never true    Ran Out of Food in the Last Year: Never true  Transportation Needs: No Transportation Needs (05/01/2022)   PRAPARE - Administrator, Civil Service (Medical): No    Lack of Transportation (Non-Medical): No  Physical Activity: Inactive (05/01/2022)   Exercise Vital Sign    Days of Exercise per Week: 0 days    Minutes of Exercise per Session: 0 min  Stress: No Stress Concern Present (05/01/2022)   [employer] of Occupational Health - Occupational Stress Questionnaire    Feeling of Stress : Not at all  Social Connections: Socially Isolated (05/01/2022)   Social Connection and Isolation Panel [NHANES]    Frequency of Communication with Friends and Family: Three times a week    Frequency of Social Gatherings with Friends and Family: Three times a week    Attends Religious Services: Never    Active Member of Clubs or Organizations: No    Attends Banker Meetings: Never    Marital Status: Never married    Objective:  There were no vitals taken for this visit.     10/19/2022    9:57 AM 05/01/2022    2:42 PM 11/16/2021    3:46 PM  BP/Weight  Systolic BP 110 116 124  Diastolic BP 70 64 64  Wt. (Lbs) 239 243 234  BMI 42.34 kg/m2 43.05 kg/m2 41.45 kg/m2     Physical Exam  Diabetic Foot Exam - Simple   No data filed      Lab Results  Component Value Date   WBC 8.9 10/19/2022   HGB 12.5 10/19/2022  HCT 37.6 10/19/2022   PLT 310 10/19/2022   GLUCOSE 196 (H) 10/19/2022   CHOL 217 (H) 10/19/2022   TRIG 281 (H) 10/19/2022   HDL 31 (L) 10/19/2022   LDLCALC 135 (H) 10/19/2022   ALT 136 (H) 10/19/2022   AST 122 (H) 10/19/2022   NA 137 10/19/2022   K 4.6 10/19/2022   CL 99 10/19/2022   CREATININE 0.91 10/19/2022   BUN 14 10/19/2022   CO2 24 10/19/2022   TSH 1.650 05/01/2022   HGBA1C 8.7 (H) 10/19/2022      Assessment & Plan:    Vitamin D deficiency  Controlled type 2 diabetes mellitus with complication, without long-term current use of insulin (HCC)  Attention deficit hyperactivity disorder, combined type  Mixed hyperlipidemia     No orders of the defined types were placed in this encounter.   No orders of the defined types were placed in this encounter.    Follow-up: No follow-ups on file.   I,Ruhee Enck I Leal-Borjas,acting as a scribe for Blane Ohara, MD.,have documented all relevant documentation on the behalf of Blane Ohara, MD,as directed by  Blane Ohara, MD while in the presence of Blane Ohara, MD.   An After Visit Summary was printed and given to the patient.  Blane Ohara, MD [facility] [phone]

## 2023-02-14 ENCOUNTER — Encounter (INDEPENDENT_AMBULATORY_CARE_PROVIDER_SITE_OTHER): Payer: Commercial Managed Care - PPO | Admitting: Family Medicine

## 2023-02-14 DIAGNOSIS — E559 Vitamin D deficiency, unspecified: Secondary | ICD-10-CM

## 2023-02-14 DIAGNOSIS — E118 Type 2 diabetes mellitus with unspecified complications: Secondary | ICD-10-CM

## 2023-02-14 DIAGNOSIS — F902 Attention-deficit hyperactivity disorder, combined type: Secondary | ICD-10-CM

## 2023-02-14 DIAGNOSIS — E782 Mixed hyperlipidemia: Secondary | ICD-10-CM

## 2023-02-15 ENCOUNTER — Encounter: Payer: Self-pay | Admitting: Family Medicine

## 2023-02-15 ENCOUNTER — Ambulatory Visit (INDEPENDENT_AMBULATORY_CARE_PROVIDER_SITE_OTHER): Payer: Commercial Managed Care - PPO | Admitting: Family Medicine

## 2023-02-15 VITALS — BP 124/82 | HR 78 | Temp 98.6°F | Ht 63.0 in | Wt 233.0 lb

## 2023-02-15 DIAGNOSIS — E559 Vitamin D deficiency, unspecified: Secondary | ICD-10-CM

## 2023-02-15 DIAGNOSIS — H66001 Acute suppurative otitis media without spontaneous rupture of ear drum, right ear: Secondary | ICD-10-CM

## 2023-02-15 DIAGNOSIS — F411 Generalized anxiety disorder: Secondary | ICD-10-CM

## 2023-02-15 DIAGNOSIS — J029 Acute pharyngitis, unspecified: Secondary | ICD-10-CM | POA: Insufficient documentation

## 2023-02-15 DIAGNOSIS — F902 Attention-deficit hyperactivity disorder, combined type: Secondary | ICD-10-CM | POA: Diagnosis not present

## 2023-02-15 DIAGNOSIS — E118 Type 2 diabetes mellitus with unspecified complications: Secondary | ICD-10-CM | POA: Diagnosis not present

## 2023-02-15 DIAGNOSIS — E782 Mixed hyperlipidemia: Secondary | ICD-10-CM

## 2023-02-15 DIAGNOSIS — R5383 Other fatigue: Secondary | ICD-10-CM

## 2023-02-15 LAB — POCT RAPID STREP A (OFFICE): Rapid Strep A Screen: NEGATIVE

## 2023-02-15 MED ORDER — AZITHROMYCIN 250 MG PO TABS: ORAL_TABLET | ORAL | 0 refills | Status: AC

## 2023-02-15 MED ORDER — AMPHETAMINE-DEXTROAMPHETAMINE 10 MG PO TABS: 10.0000 mg | ORAL_TABLET | Freq: Every day | ORAL | 0 refills | Status: DC

## 2023-02-15 MED ORDER — CITALOPRAM HYDROBROMIDE 40 MG PO TABS: 40.0000 mg | ORAL_TABLET | Freq: Every day | ORAL | 3 refills | Status: DC

## 2023-02-15 MED ORDER — FLUTICASONE PROPIONATE 50 MCG/ACT NA SUSP: 2.0000 | Freq: Every day | NASAL | 6 refills | Status: DC

## 2023-02-15 NOTE — Assessment & Plan Note (Addendum)
Uncontrolled Lab Results  Component Value Date   LDLCALC 135 (H) 10/19/2022    No major side effects or issues taking the Crestor 10 mg Labs drawn today  Will adjust medication as needed depending on labs

## 2023-02-15 NOTE — Assessment & Plan Note (Signed)
Lab Results  Component Value Date   HGBA1C 8.7 (H) 10/19/2022   HGBA1C 6.6 (H) 05/01/2022  Uncontrolled Labs drawn today Will adjust medication as needed based on labs -Patient experiencing significant gastrointestinal side effects from Ozempic, including indigestion and vomiting. Discussed the mechanism of GLP-1 agonists and the importance of small, low-fat meals. -Side effects were nausea with eating large meals Discussed eating smaller meals more frequently throughout the day -Continue taking Ozempic 1.0 mg every week until symptoms start to get better. -Consider reducing Ozempic dose.

## 2023-02-15 NOTE — Assessment & Plan Note (Signed)
Controlled -Patient not currently taking, patient would like a refill Will monitor symptoms and let us know if we need to make any adjustments.

## 2023-02-15 NOTE — Assessment & Plan Note (Signed)
Acute Patient presents with cough, congestion, and a sore throat. Physical exam reveals redness in the throat and right ear. -Prescribe Azithromycin (Z-Pak) for suspected ear infection and possible strep throat. -Recommend over-the-counter Mucinex for congestion.Educated on possible side effects of medication and that she may try a probiotic for GI symptoms

## 2023-02-15 NOTE — Assessment & Plan Note (Signed)
Labs drawn Await labs/testing for assessment and recommendations.

## 2023-02-15 NOTE — Assessment & Plan Note (Signed)
Strep test positive

## 2023-02-15 NOTE — Progress Notes (Addendum)
Subjective:  Patient ID: Anita Peters, female    DOB: 02/24/1991  Age: 32 y.o. MRN: [id number]  Chief Complaint  Patient presents with   Medical Management of Chronic Issues    Discussed the use of AI scribe software for clinical note transcription with the patient, who gave verbal consent to proceed.   HPI The patient, with a history of anxiety, depression, and Irritable Bowel Syndrome (IBS), presents with multiple complaints.   Diabetes type 2: Currently on Ozempic 1 mg. States she has some nausea symptoms. The primary concern is indigestion, which the patient attributes to Ozempic, a medication they have been on for about six to seven weeks. The patient describes experiencing frequent burps that are unpleasant and sometimes lead to gagging. The patient has tried managing the indigestion with small meals and Pepto-Bismol, but the symptoms persist.  ADD: Patient is currently not taking Adderall 10 mg daily. Needs a refill.    GAD: She is taking escitalopram 20 mg daily. Doing well on medicine.  States that she has some breakthrough depressive episode due to her uncle recently passed away in 05/26/2022.  States that she is unsure if it is more so grieving or if it is depression.  States that the medicine has not been helping her with the symptoms and that they have been getting worse.  Denies any suicidal or homicidal ideation.  States she mostly just has fatigue and anhedonia.The patient's mental health history includes anxiety and depression. They are currently on Lexapro (Escitalopram) and have been previously prescribed Vraylar. The patient has not been taking their Adderall prescription recently. They report experiencing dizziness when on Lexapro, describing it as a feeling of vertigo that lasts for a few seconds.   GERD: Taking Pantoprazole 40 mg daily. She is stable on medicines  Fatigue:The patient also reports fatigue, which they describe as feeling tired "24/7". They have been on Vitamin D  supplements in the past, which seemed to improve their energy levels.   URI symptoms The patient has also been experiencing a cough and congestion for about one to two weeks. They have been managing these symptoms with over-the-counter cold and flu medication and Zyrtec D. The patient also reports a feeling of fluid in the right ear, suggesting a possible ear infection.      02/15/2023    8:46 AM 10/19/2022   10:16 AM 05/01/2022    2:45 PM 09/09/2020   12:09 PM 09/09/2020    9:54 AM  Depression screen PHQ 2/9  Decreased Interest 1 1 0 1 0  Down, Depressed, Hopeless 1 1 0 0 0  PHQ - 2 Score 2 2 0 1 0  Altered sleeping 2 1  1    Tired, decreased energy 3 1  2    Change in appetite 1 1  1    Feeling bad or failure about yourself  0 0  1   Trouble concentrating 0 0  1   Moving slowly or fidgety/restless 0 0  0   Suicidal thoughts 0 0  0   PHQ-9 Score 8 5  7    Difficult doing work/chores Somewhat difficult Not difficult at all  Somewhat difficult         10/19/2022   10:16 AM  Fall Risk   Falls in the past year? 0  Number falls in past yr: 0  Injury with Fall? 0  Risk for fall due to : No Fall Risks  Follow up Falls prevention discussed    Patient Care Team:  Renne Crigler, FNP as PCP - General (Family Medicine)   Review of Systems  Constitutional:  Negative for chills, fatigue and fever.  HENT:  Negative for congestion, ear pain, rhinorrhea and sore throat.   Respiratory:  Negative for cough and shortness of breath.   Cardiovascular:  Negative for chest pain.  Gastrointestinal:  Positive for diarrhea and nausea (with Ozempic). Negative for abdominal pain, constipation and vomiting.       Ozempic burps  Genitourinary:  Negative for dysuria and urgency.  Musculoskeletal:  Negative for back pain and myalgias.  Neurological:  Negative for dizziness, weakness, light-headedness and headaches.  Psychiatric/Behavioral:  Negative for dysphoric mood. The patient is not nervous/anxious.      Current Outpatient Medications on File Prior to Visit  Medication Sig Dispense Refill   cariprazine (VRAYLAR) 1.5 MG capsule Take 1 capsule (1.5 mg total) by mouth daily. 30 capsule 0   escitalopram (LEXAPRO) 20 MG tablet Take 1 tablet (20 mg total) by mouth daily. 30 tablet 2   norethindrone-ethinyl estradiol-iron (JUNEL FE 1.5/30) 1.5-30 MG-MCG tablet Take 1 tablet by mouth daily. 84 tablet 3   ondansetron (ZOFRAN) 4 MG tablet Take 1 tablet (4 mg total) by mouth every 8 (eight) hours as needed for nausea or vomiting. 40 tablet 0   pantoprazole (PROTONIX) 40 MG tablet Take 1 tablet (40 mg total) by mouth daily. 30 tablet 6   rosuvastatin (CRESTOR) 10 MG tablet Take 1 tablet (10 mg total) by mouth daily. 30 tablet 2   valACYclovir (VALTREX) 500 MG tablet Take 1 tablet (500 mg total) by mouth daily. 30 tablet 6   Vitamin D, Ergocalciferol, (DRISDOL) 1.25 MG (50000 UNIT) CAPS capsule Take 1 capsule (50,000 Units total) by mouth every 7 (seven) days. 5 capsule 0   No current facility-administered medications on file prior to visit.   Past Medical History:  Diagnosis Date   Antepartum mild preeclampsia 11/14/2016   Formatting of this note might be different from the original. Seen for repeat BP check in triage 12/04/16 Rescue BMZ 10/8 - 10/9  HELLP labs normal/stable 12/16/16, except urine P/C 10.7 Dr. Cyndie Chime spoke with Dr. Particia Nearing (MFM) on 12/18/16 regarding patients clinical care with severe BP on 10/19 (though not 4h apart) and worsening proteinuria with urine P/C of 10.7 (previously 0.3 in August).    Migraine without aura and without status migrainosus, not intractable 07/02/2019   Pregnancy induced hypertension    Rh negative state in antepartum period 06/09/2016   Short cervix affecting pregnancy 09/26/2016   Past Surgical History:  Procedure Laterality Date   CESAREAN SECTION  12/20/2016   CHOLECYSTECTOMY, LAPAROSCOPIC  06/23/2022   Dr Lequita Halt   DILATION AND CURETTAGE OF UTERUS      WISDOM TOOTH EXTRACTION      Family History  Problem Relation Age of Onset   Hyperlipidemia Mother    Diabetes Mother    Diabetes Maternal Uncle    Diabetes Maternal Grandmother    Social History   Socioeconomic History   Marital status: Single    Spouse name: Not on file   Number of children: 2   Years of education: Not on file   Highest education level: Not on file  Occupational History   Occupation: CNA  Tobacco Use   Smoking status: Never   Smokeless tobacco: Never  Vaping Use   Vaping status: Never Used  Substance and Sexual Activity   Alcohol use: Not Currently    Alcohol/week: 2.0 standard drinks  of alcohol    Types: 2 Shots of liquor per week    Comment: Every 6 months   Drug use: No   Sexual activity: Not Currently    Birth control/protection: None  Other Topics Concern   Not on file  Social History Narrative   Not on file   Social Drivers of Health   Financial Resource Strain: Low Risk  (05/01/2022)   Overall Financial Resource Strain (CARDIA)    Difficulty of Paying Living Expenses: Not hard at all  Food Insecurity: No Food Insecurity (05/01/2022)   Hunger Vital Sign    Worried About Running Out of Food in the Last Year: Never true    Ran Out of Food in the Last Year: Never true  Transportation Needs: No Transportation Needs (05/01/2022)   PRAPARE - Administrator, Civil Service (Medical): No    Lack of Transportation (Non-Medical): No  Physical Activity: Inactive (05/01/2022)   Exercise Vital Sign    Days of Exercise per Week: 0 days    Minutes of Exercise per Session: 0 min  Stress: No Stress Concern Present (05/01/2022)   [employer] of Occupational Health - Occupational Stress Questionnaire    Feeling of Stress : Not at all  Social Connections: Socially Isolated (05/01/2022)   Social Connection and Isolation Panel [NHANES]    Frequency of Communication with Friends and Family: Three times a week    Frequency of Social Gatherings with  Friends and Family: Three times a week    Attends Religious Services: Never    Active Member of Clubs or Organizations: No    Attends Banker Meetings: Never    Marital Status: Never married    Objective:  BP 124/82   Pulse 78   Temp 98.6 F (37 C)   Ht 5\' 3"  (1.6 m)   Wt 233 lb (105.7 kg)   SpO2 98%   BMI 41.27 kg/m      02/15/2023    8:44 AM 10/19/2022    9:57 AM 05/01/2022    2:42 PM  BP/Weight  Systolic BP 124 110 116  Diastolic BP 82 70 64  Wt. (Lbs) 233 239 243  BMI 41.27 kg/m2 42.34 kg/m2 43.05 kg/m2    Physical Exam Vitals reviewed.  Constitutional:      Appearance: Normal appearance. She is normal weight.  HENT:     Right Ear: A middle ear effusion is present. Tympanic membrane is erythematous.     Left Ear: There is impacted cerumen.  Eyes:     Conjunctiva/sclera: Conjunctivae normal.  Cardiovascular:     Rate and Rhythm: Normal rate and regular rhythm.     Heart sounds: Normal heart sounds.  Pulmonary:     Effort: Pulmonary effort is normal. No respiratory distress.     Breath sounds: Normal breath sounds.  Abdominal:     General: Abdomen is flat. Bowel sounds are normal.     Palpations: Abdomen is soft.     Tenderness: There is no abdominal tenderness.  Musculoskeletal:        General: Normal range of motion.     Cervical back: Normal range of motion.  Skin:    General: Skin is warm.  Neurological:     Mental Status: She is alert and oriented to person, place, and time.  Psychiatric:        Mood and Affect: Mood normal.        Behavior: Behavior normal.     Diabetic Foot  Exam - Simple   Simple Foot Form Visual Inspection No deformities, no ulcerations, no other skin breakdown bilaterally: Yes Sensation Testing Intact to touch and monofilament testing bilaterally: Yes Pulse Check Posterior Tibialis and Dorsalis pulse intact bilaterally: Yes Comments      Lab Results  Component Value Date   WBC 8.9 10/19/2022   HGB 12.5  10/19/2022   HCT 37.6 10/19/2022   PLT 310 10/19/2022   GLUCOSE 196 (H) 10/19/2022   CHOL 217 (H) 10/19/2022   TRIG 281 (H) 10/19/2022   HDL 31 (L) 10/19/2022   LDLCALC 135 (H) 10/19/2022   ALT 136 (H) 10/19/2022   AST 122 (H) 10/19/2022   NA 137 10/19/2022   K 4.6 10/19/2022   CL 99 10/19/2022   CREATININE 0.91 10/19/2022   BUN 14 10/19/2022   CO2 24 10/19/2022   TSH 1.650 05/01/2022   HGBA1C 8.7 (H) 10/19/2022      Assessment & Plan:    Mixed hyperlipidemia Assessment & Plan: Uncontrolled Lab Results  Component Value Date   LDLCALC 135 (H) 10/19/2022    No major side effects or issues taking the Crestor 10 mg Labs drawn today  Will adjust medication as needed depending on labs  Orders: -     Comprehensive metabolic panel -     CBC with Differential/Platelet -     Lipid panel  Attention deficit hyperactivity disorder, combined type Assessment & Plan: Controlled -Patient not currently taking, patient would like a refill Will monitor symptoms and let us know if we need to make any adjustments.  Orders: -     Amphetamine-Dextroamphetamine; Take 1 tablet (10 mg total) by mouth daily with breakfast.  Dispense: 30 tablet; Refill: 0  Controlled type 2 diabetes mellitus with complication, without long-term current use of insulin (HCC) Assessment & Plan: Lab Results  Component Value Date   HGBA1C 8.7 (H) 10/19/2022   HGBA1C 6.6 (H) 05/01/2022  Uncontrolled Labs drawn today Will adjust medication as needed based on labs -Patient experiencing significant gastrointestinal side effects from Ozempic, including indigestion and vomiting. Discussed the mechanism of GLP-1 agonists and the importance of small, low-fat meals. -Side effects were nausea with eating large meals Discussed eating smaller meals more frequently throughout the day -Continue taking Ozempic 1.0 mg every week until symptoms start to get better. -Consider reducing Ozempic dose.    Orders: -      Hemoglobin A1c  Vitamin D deficiency Assessment & Plan: Labs drawn Await labs/testing for assessment and recommendations   Orders: -     VITAMIN D 25 Hydroxy (Vit-D Deficiency, Fractures)  Generalized anxiety disorder Assessment & Plan: Not controlled    02/15/2023    8:46 AM 10/19/2022   10:16 AM 05/01/2022    2:45 PM  Depression screen PHQ 2/9  Decreased Interest 1 1 0  Down, Depressed, Hopeless 1 1 0  PHQ - 2 Score 2 2 0  Altered sleeping 2 1   Tired, decreased energy 3 1   Change in appetite 1 1   Feeling bad or failure about yourself  0 0   Trouble concentrating 0 0   Moving slowly or fidgety/restless 0 0   Suicidal thoughts 0 0   PHQ-9 Score 8 5   Difficult doing work/chores Somewhat difficult Not difficult at all     Increase Lexapro 40 mg as prescribed -Not taking the Vraylor anymore Will continue to monitor symptoms  Orders: -     Citalopram Hydrobromide; Take 1 tablet (40  mg total) by mouth daily.  Dispense: 30 tablet; Refill: 3  Other fatigue Assessment & Plan: Labs drawn Await labs/testing for assessment and recommendations   Orders: -     TSH  Pharyngitis, unspecified etiology Assessment & Plan: Strep test - positive  Orders: -     POCT rapid strep A  Non-recurrent acute suppurative otitis media of right ear without spontaneous rupture of tympanic membrane Assessment & Plan: Acute Patient presents with cough, congestion, and a sore throat. Physical exam reveals redness in the throat and right ear. -Prescribe Azithromycin (Z-Pak) for suspected ear infection and possible strep throat. -Recommend over-the-counter Mucinex for congestion.Educated on possible side effects of medication and that she may try a probiotic for GI symptoms    Orders: -     Azithromycin; Take 2 tablets on day 1, then 1 tablet daily on days 2 through 5  Dispense: 6 tablet; Refill: 0 -     Fluticasone Propionate; Place 2 sprays into both nostrils daily.  Dispense: 16 g;  Refill: 6     Meds ordered this encounter  Medications   citalopram (CELEXA) 40 MG tablet    Sig: Take 1 tablet (40 mg total) by mouth daily.    Dispense:  30 tablet    Refill:  3   amphetamine-dextroamphetamine (ADDERALL) 10 MG tablet    Sig: Take 1 tablet (10 mg total) by mouth daily with breakfast.    Dispense:  30 tablet    Refill:  0   azithromycin (ZITHROMAX) 250 MG tablet    Sig: Take 2 tablets on day 1, then 1 tablet daily on days 2 through 5    Dispense:  6 tablet    Refill:  0   fluticasone (FLONASE) 50 MCG/ACT nasal spray    Sig: Place 2 sprays into both nostrils daily.    Dispense:  16 g    Refill:  6    Orders Placed This Encounter  Procedures   Comprehensive metabolic panel   CBC with Differential   Lipid Panel   Hemoglobin A1c   TSH   Vitamin D, 25-hydroxy   Rapid Strep A     Follow-up: Return in about 3 months (around 05/16/2023) for chronic.   I,Katherina A Bramblett,acting as a scribe for Renne Crigler, FNP.,have documented all relevant documentation on the behalf of Renne Crigler, FNP,as directed by  Renne Crigler, FNP while in the presence of Renne Crigler, FNP.   An After Visit Summary was printed and given to the patient.  Total time spent on today's visit was 47 minutes, including both face-to-face time and nonface-to-face time personally spent on review of chart (labs and imaging), discussing labs and goals, discussing further work-up, treatment options, referrals to specialist if needed, reviewing outside records if pertinent, answering patient's questions, and coordinating care.   Lajuana Matte, FNP [facility] [phone]

## 2023-02-15 NOTE — Assessment & Plan Note (Signed)
Not controlled    02/15/2023    8:46 AM 10/19/2022   10:16 AM 05/01/2022    2:45 PM  Depression screen PHQ 2/9  Decreased Interest 1 1 0  Down, Depressed, Hopeless 1 1 0  PHQ - 2 Score 2 2 0  Altered sleeping 2 1   Tired, decreased energy 3 1   Change in appetite 1 1   Feeling bad or failure about yourself  0 0   Trouble concentrating 0 0   Moving slowly or fidgety/restless 0 0   Suicidal thoughts 0 0   PHQ-9 Score 8 5   Difficult doing work/chores Somewhat difficult Not difficult at all     Increase Lexapro 40 mg as prescribed -Not taking the Vraylor anymore Will continue to monitor symptoms

## 2023-02-16 ENCOUNTER — Telehealth: Payer: Self-pay | Admitting: Family Medicine

## 2023-02-16 ENCOUNTER — Other Ambulatory Visit: Payer: Self-pay | Admitting: Family Medicine

## 2023-02-16 DIAGNOSIS — R748 Abnormal levels of other serum enzymes: Secondary | ICD-10-CM

## 2023-02-16 LAB — COMPREHENSIVE METABOLIC PANEL
ALT: 258 [IU]/L (ref 0–32)
AST: 226 [IU]/L (ref 0–40)
Albumin: 4.3 g/dL (ref 3.9–4.9)
Alkaline Phosphatase: 98 [IU]/L (ref 44–121)
BUN/Creatinine Ratio: 15 (ref 9–23)
BUN: 12 mg/dL (ref 6–20)
Bilirubin Total: 0.4 mg/dL (ref 0.0–1.2)
CO2: 20 mmol/L (ref 20–29)
Calcium: 10 mg/dL (ref 8.7–10.2)
Chloride: 94 mmol/L — ABNORMAL LOW (ref 96–106)
Creatinine, Ser: 0.8 mg/dL (ref 0.57–1.00)
Globulin, Total: 3.4 g/dL (ref 1.5–4.5)
Glucose: 300 mg/dL — ABNORMAL HIGH (ref 70–99)
Potassium: 4.8 mmol/L (ref 3.5–5.2)
Sodium: 134 mmol/L (ref 134–144)
Total Protein: 7.7 g/dL (ref 6.0–8.5)
eGFR: 100 mL/min/{1.73_m2} (ref >59)

## 2023-02-16 LAB — CBC WITH DIFFERENTIAL/PLATELET
Basophils Absolute: 0.1 10*3/uL (ref 0.0–0.2)
Basos: 0 %
EOS (ABSOLUTE): 0.2 10*3/uL (ref 0.0–0.4)
Eos: 2 %
Hematocrit: 45.7 % (ref 34.0–46.6)
Hemoglobin: 14.5 g/dL (ref 11.1–15.9)
Immature Grans (Abs): 0.1 10*3/uL (ref 0.0–0.1)
Immature Granulocytes: 1 %
Lymphocytes Absolute: 3.7 10*3/uL — ABNORMAL HIGH (ref 0.7–3.1)
Lymphs: 32 %
MCH: 26.8 pg (ref 26.6–33.0)
MCHC: 31.7 g/dL (ref 31.5–35.7)
MCV: 84 fL (ref 79–97)
Monocytes Absolute: 0.8 10*3/uL (ref 0.1–0.9)
Monocytes: 7 %
Neutrophils Absolute: 6.9 10*3/uL (ref 1.4–7.0)
Neutrophils: 58 %
Platelets: 374 10*3/uL (ref 150–450)
RBC: 5.42 x10E6/uL — ABNORMAL HIGH (ref 3.77–5.28)
RDW: 14.1 % (ref 11.7–15.4)
WBC: 11.7 10*3/uL — ABNORMAL HIGH (ref 3.4–10.8)

## 2023-02-16 LAB — TSH: TSH: 4.09 u[IU]/mL (ref 0.450–4.500)

## 2023-02-16 LAB — HEMOGLOBIN A1C
Est. average glucose Bld gHb Est-mCnc: 286 mg/dL
Hgb A1c MFr Bld: 11.6 % — ABNORMAL HIGH (ref 4.8–5.6)

## 2023-02-16 LAB — VITAMIN D 25 HYDROXY (VIT D DEFICIENCY, FRACTURES): Vit D, 25-Hydroxy: 27.7 ng/mL — ABNORMAL LOW (ref 30.0–100.0)

## 2023-02-16 LAB — LIPID PANEL
Chol/HDL Ratio: 8.8 {ratio} — ABNORMAL HIGH (ref 0.0–4.4)
Cholesterol, Total: 254 mg/dL — ABNORMAL HIGH (ref 100–199)
HDL: 29 mg/dL — ABNORMAL LOW (ref >39)
LDL Chol Calc (NIH): 136 mg/dL — ABNORMAL HIGH (ref 0–99)
Triglycerides: 485 mg/dL — ABNORMAL HIGH (ref 0–149)
VLDL Cholesterol Cal: 89 mg/dL — ABNORMAL HIGH (ref 5–40)

## 2023-02-16 NOTE — Telephone Encounter (Signed)
Spoke to patient regarding her labs and that I ordered a hep panel and abdominal US for her critical liver enzymes.  She also needs to stop the Ozempic and make an appointment to see someone ASAP to manage her blood sugar.

## 2023-02-16 NOTE — Progress Notes (Signed)
Spoke to patient regarding her labs and let her know that I would like for her to discontinue the Ozempic and make an appointment to recheck Hep. labs and someone will call her to get an abdominal US scheduled to check her liver.

## 2023-02-17 NOTE — Progress Notes (Signed)
Cancelled. Dr. Cox  

## 2023-02-19 ENCOUNTER — Encounter: Payer: Self-pay | Admitting: Family Medicine

## 2023-02-19 ENCOUNTER — Telehealth: Payer: Self-pay | Admitting: Family Medicine

## 2023-02-19 ENCOUNTER — Ambulatory Visit (INDEPENDENT_AMBULATORY_CARE_PROVIDER_SITE_OTHER): Payer: Commercial Managed Care - PPO | Admitting: Family Medicine

## 2023-02-19 VITALS — BP 116/72 | HR 90 | Temp 97.6°F | Ht 63.0 in | Wt 235.0 lb

## 2023-02-19 DIAGNOSIS — E118 Type 2 diabetes mellitus with unspecified complications: Secondary | ICD-10-CM

## 2023-02-19 DIAGNOSIS — E1165 Type 2 diabetes mellitus with hyperglycemia: Secondary | ICD-10-CM

## 2023-02-19 DIAGNOSIS — K7581 Nonalcoholic steatohepatitis (NASH): Secondary | ICD-10-CM | POA: Diagnosis not present

## 2023-02-19 DIAGNOSIS — R748 Abnormal levels of other serum enzymes: Secondary | ICD-10-CM

## 2023-02-19 MED ORDER — TOUJEO MAX SOLOSTAR 300 UNIT/ML ~~LOC~~ SOPN: PEN_INJECTOR | SUBCUTANEOUS | 1 refills | Status: DC

## 2023-02-19 MED ORDER — DEXCOM G7 SENSOR MISC: 3 refills | Status: DC

## 2023-02-19 MED ORDER — METFORMIN HCL 500 MG PO TABS: 500.0000 mg | ORAL_TABLET | Freq: Two times a day (BID) | ORAL | 0 refills | Status: DC

## 2023-02-19 NOTE — Progress Notes (Unsigned)
Subjective:  Patient ID: Anita Peters, female    DOB: 1990-04-27  Age: 32 y.o. MRN: [id number]  Chief Complaint  Patient presents with   Elevated Liver Functions    HPI The patient, with a history of diabetes, presents with an earache that is improving. She also reports a cough and back tightness, but denies chest pain and wheezing. She has been urinating frequently, which she attributes to increased fluid intake, but acknowledges that it could be due to elevated blood sugar levels. She also reports variable appetite.  The patient experiences frequent cramps in her lower legs and feet, particularly during sleep. She has been prescribed insulin and metformin for her diabetes. She also reports dryness and sensation in her feet. Her ozempic was stopped due to nausea/gerd. She is here to review her labs and discuss diabetes medicines. Her A1C was 11.6. Her cholesterol is very high. Triglycerides 254, triglycerides 485 and HDL low at 29.    The patient has a history of elevated liver enzymes, which she attributes to fatty liver disease. She denies alcohol consumption and reports that her last liver ultrasound showed fatty liver. She expresses concern about her liver function and is interested in potential treatments for fatty liver disease.     02/15/2023    8:46 AM 10/19/2022   10:16 AM 05/01/2022    2:45 PM 09/09/2020   12:09 PM 09/09/2020    9:54 AM  Depression screen PHQ 2/9  Decreased Interest 1 1 0 1 0  Down, Depressed, Hopeless 1 1 0 0 0  PHQ - 2 Score 2 2 0 1 0  Altered sleeping 2 1  1    Tired, decreased energy 3 1  2    Change in appetite 1 1  1    Feeling bad or failure about yourself  0 0  1   Trouble concentrating 0 0  1   Moving slowly or fidgety/restless 0 0  0   Suicidal thoughts 0 0  0   PHQ-9 Score 8 5  7    Difficult doing work/chores Somewhat difficult Not difficult at all  Somewhat difficult         10/19/2022   10:16 AM  Fall Risk   Falls in the past year? 0  Number  falls in past yr: 0  Injury with Fall? 0  Risk for fall due to : No Fall Risks  Follow up Falls prevention discussed    Patient Care Team: Renne Crigler, FNP as PCP - General (Family Medicine)   Review of Systems  Constitutional:  Negative for appetite change, fatigue and fever.  HENT:  Positive for ear pain. Negative for congestion, sinus pressure and sore throat.   Respiratory:  Positive for cough. Negative for chest tightness, shortness of breath and wheezing.   Cardiovascular:  Negative for chest pain and palpitations.  Gastrointestinal:  Negative for abdominal pain, constipation, diarrhea, nausea and vomiting.  Endocrine: Positive for polydipsia and polyuria.  Genitourinary:  Negative for dysuria and hematuria.  Musculoskeletal:  Positive for myalgias (cramps in lower legs and feet.). Negative for arthralgias, back pain and joint swelling.  Skin:  Negative for rash.  Neurological:  Negative for dizziness, weakness and headaches.  Psychiatric/Behavioral:  Negative for dysphoric mood. The patient is not nervous/anxious.     Current Outpatient Medications on File Prior to Visit  Medication Sig Dispense Refill   amphetamine-dextroamphetamine (ADDERALL) 10 MG tablet Take 1 tablet (10 mg total) by mouth daily with breakfast. 30 tablet 0  cariprazine (VRAYLAR) 1.5 MG capsule Take 1 capsule (1.5 mg total) by mouth daily. 30 capsule 0   citalopram (CELEXA) 40 MG tablet Take 1 tablet (40 mg total) by mouth daily. 30 tablet 3   escitalopram (LEXAPRO) 20 MG tablet Take 1 tablet (20 mg total) by mouth daily. 30 tablet 2   fluticasone (FLONASE) 50 MCG/ACT nasal spray Place 2 sprays into both nostrils daily. 16 g 6   norethindrone-ethinyl estradiol-iron (JUNEL FE 1.5/30) 1.5-30 MG-MCG tablet Take 1 tablet by mouth daily. 84 tablet 3   ondansetron (ZOFRAN) 4 MG tablet Take 1 tablet (4 mg total) by mouth every 8 (eight) hours as needed for nausea or vomiting. 40 tablet 0   pantoprazole  (PROTONIX) 40 MG tablet Take 1 tablet (40 mg total) by mouth daily. 30 tablet 6   rosuvastatin (CRESTOR) 10 MG tablet Take 1 tablet (10 mg total) by mouth daily. 30 tablet 2   valACYclovir (VALTREX) 500 MG tablet Take 1 tablet (500 mg total) by mouth daily. 30 tablet 6   No current facility-administered medications on file prior to visit.   Past Medical History:  Diagnosis Date   Antepartum mild preeclampsia 11/14/2016   Formatting of this note might be different from the original. Seen for repeat BP check in triage 12/04/16 Rescue BMZ 10/8 - 10/9  HELLP labs normal/stable 12/16/16, except urine P/C 10.7 Dr. Cyndie Chime spoke with Dr. Particia Nearing (MFM) on 12/18/16 regarding patients clinical care with severe BP on 10/19 (though not 4h apart) and worsening proteinuria with urine P/C of 10.7 (previously 0.3 in August).    Migraine without aura and without status migrainosus, not intractable 07/02/2019   Pregnancy induced hypertension    Rh negative state in antepartum period 06/09/2016   Short cervix affecting pregnancy 09/26/2016   Past Surgical History:  Procedure Laterality Date   CESAREAN SECTION  12/20/2016   CHOLECYSTECTOMY, LAPAROSCOPIC  06/23/2022   Dr Lequita Halt   DILATION AND CURETTAGE OF UTERUS     WISDOM TOOTH EXTRACTION      Family History  Problem Relation Age of Onset   Hyperlipidemia Mother    Diabetes Mother    Diabetes Maternal Uncle    Diabetes Maternal Grandmother    Social History   Socioeconomic History   Marital status: Single    Spouse name: Not on file   Number of children: 2   Years of education: Not on file   Highest education level: Not on file  Occupational History   Occupation: CNA  Tobacco Use   Smoking status: Never   Smokeless tobacco: Never  Vaping Use   Vaping status: Never Used  Substance and Sexual Activity   Alcohol use: Not Currently    Alcohol/week: 2.0 standard drinks of alcohol    Types: 2 Shots of liquor per week    Comment: Every 6 months    Drug use: No   Sexual activity: Not Currently    Birth control/protection: None  Other Topics Concern   Not on file  Social History Narrative   Not on file   Social Drivers of Health   Financial Resource Strain: Low Risk  (05/01/2022)   Overall Financial Resource Strain (CARDIA)    Difficulty of Paying Living Expenses: Not hard at all  Food Insecurity: No Food Insecurity (05/01/2022)   Hunger Vital Sign    Worried About Running Out of Food in the Last Year: Never true    Ran Out of Food in the Last Year: Never true  Transportation Needs: No Transportation Needs (05/01/2022)   PRAPARE - Administrator, Civil Service (Medical): No    Lack of Transportation (Non-Medical): No  Physical Activity: Inactive (05/01/2022)   Exercise Vital Sign    Days of Exercise per Week: 0 days    Minutes of Exercise per Session: 0 min  Stress: No Stress Concern Present (05/01/2022)   [employer] of Occupational Health - Occupational Stress Questionnaire    Feeling of Stress : Not at all  Social Connections: Socially Isolated (05/01/2022)   Social Connection and Isolation Panel [NHANES]    Frequency of Communication with Friends and Family: Three times a week    Frequency of Social Gatherings with Friends and Family: Three times a week    Attends Religious Services: Never    Active Member of Clubs or Organizations: No    Attends Banker Meetings: Never    Marital Status: Never married    Objective:  BP 116/72 (BP Location: Left Arm, Patient Position: Sitting)   Pulse 90   Temp 97.6 F (36.4 C) (Temporal)   Ht 5\' 3"  (1.6 m)   Wt 235 lb (106.6 kg)   SpO2 98%   BMI 41.63 kg/m      02/19/2023    9:26 AM 02/15/2023    8:44 AM 10/19/2022    9:57 AM  BP/Weight  Systolic BP 116 124 110  Diastolic BP 72 82 70  Wt. (Lbs) 235 233 239  BMI 41.63 kg/m2 41.27 kg/m2 42.34 kg/m2    Physical Exam Vitals reviewed.  Constitutional:      Appearance: Normal appearance. She is  obese.  HENT:     Right Ear: Tympanic membrane normal.     Left Ear: Tympanic membrane normal.     Nose: No congestion.     Mouth/Throat:     Pharynx: No oropharyngeal exudate or posterior oropharyngeal erythema.  Neck:     Vascular: No carotid bruit.  Cardiovascular:     Rate and Rhythm: Normal rate and regular rhythm.     Heart sounds: Normal heart sounds.  Pulmonary:     Effort: Pulmonary effort is normal. No respiratory distress.     Breath sounds: Normal breath sounds.  Abdominal:     General: Abdomen is flat. Bowel sounds are normal.     Palpations: Abdomen is soft.     Tenderness: There is no abdominal tenderness.     Comments: Hepatomegaly  Neurological:     Mental Status: She is alert and oriented to person, place, and time.  Psychiatric:        Mood and Affect: Mood normal.        Behavior: Behavior normal.     Diabetic Foot Exam - Simple   Simple Foot Form  02/19/2023 10:08 PM  Visual Inspection No deformities, no ulcerations, no other skin breakdown bilaterally: Yes Sensation Testing Intact to touch and monofilament testing bilaterally: Yes Pulse Check Posterior Tibialis and Dorsalis pulse intact bilaterally: Yes Comments      Lab Results  Component Value Date   WBC 9.3 02/19/2023   HGB 13.2 02/19/2023   HCT 40.8 02/19/2023   PLT 318 02/19/2023   GLUCOSE 271 (H) 02/19/2023   CHOL 254 (H) 02/15/2023   TRIG 485 (H) 02/15/2023   HDL 29 (L) 02/15/2023   LDLCALC 136 (H) 02/15/2023   ALT 312 (HH) 02/19/2023   AST 299 (HH) 02/19/2023   NA 135 02/19/2023   K 4.6 02/19/2023   CL  97 02/19/2023   CREATININE 0.81 02/19/2023   BUN 17 02/19/2023   CO2 21 02/19/2023   TSH 4.090 02/15/2023   HGBA1C 11.6 (H) 02/15/2023      Assessment & Plan:    Elevated liver enzymes Assessment & Plan: Check labs  Orders: -     Comprehensive metabolic panel -     CBC with Differential/Platelet -     Acute Hep Panel & Hep B Surface Ab  Uncontrolled type 2  diabetes mellitus with hyperglycemia, without long-term current use of insulin (HCC) Assessment & Plan: Recommend start on toujeo 20 U daily. Increase every 3 days until sugars are consistently less than 140.  Start on metformin 500 mg twice a day Start free style libre continuous glucose monitor.   Orders: -     metFORMIN HCl; Take 1 tablet (500 mg total) by mouth 2 (two) times daily with a meal.  Dispense: 180 tablet; Refill: 0 -     Toujeo Max SoloStar; 20 U daily. Increase by 2 U every 3 days until sugars less than 140.  Dispense: 3 mL; Refill: 1 -     Dexcom G7 Sensor; Change every 10 days.  Dispense: 9 each; Refill: 3  NASH (nonalcoholic steatohepatitis) Assessment & Plan: Refer to [facility].      Meds ordered this encounter  Medications   metFORMIN (GLUCOPHAGE) 500 MG tablet    Sig: Take 1 tablet (500 mg total) by mouth 2 (two) times daily with a meal.    Dispense:  180 tablet    Refill:  0   insulin glargine, 2 Unit Dial, (TOUJEO MAX SOLOSTAR) 300 UNIT/ML Solostar Pen    Sig: 20 U daily. Increase by 2 U every 3 days until sugars less than 140.    Dispense:  3 mL    Refill:  1   Continuous Glucose Sensor (DEXCOM G7 SENSOR) MISC    Sig: Change every 10 days.    Dispense:  9 each    Refill:  3    Orders Placed This Encounter  Procedures   Comprehensive metabolic panel   CBC with Differential/Platelet   Acute Hep Panel & Hep B Surface Ab     Follow-up: Return in about 3 months (around 05/20/2023) for chronic fasting.   I,Lauren M Auman,acting as a scribe for Blane Ohara, MD.,have documented all relevant documentation on the behalf of Blane Ohara, MD,as directed by  Blane Ohara, MD while in the presence of Blane Ohara, MD.   Clayborn Bigness I Leal-Borjas,acting as a scribe for Blane Ohara, MD.,have documented all relevant documentation on the behalf of Blane Ohara, MD,as directed by  Blane Ohara, MD while in the presence of Blane Ohara, MD.    An After Visit Summary was  printed and given to the patient.  I attest that I have reviewed this visit and agree with the plan scribed by my staff.   Blane Ohara, MD [facility] [phone]

## 2023-02-19 NOTE — Telephone Encounter (Signed)
   Anita Peters has been scheduled for the following appointment:  WHAT: GB/RUQ ULTRASOUND WHERE: [facility] DATE: 02/22/2023 TIME: 8:00 AM CHECK-IN  Unable to leave message for patient.

## 2023-02-19 NOTE — Patient Instructions (Addendum)
Recommend start on toujeo 20 U daily. Increase every 3 days until sugars are consistently less than 140.  Start on metformin 500 mg twice a day Start free style libre continuous glucose monitor.

## 2023-02-19 NOTE — Telephone Encounter (Signed)
Spoke to patient's mom, advised her of patient's ultrasound appointment

## 2023-02-20 DIAGNOSIS — R748 Abnormal levels of other serum enzymes: Secondary | ICD-10-CM | POA: Insufficient documentation

## 2023-02-20 LAB — CBC WITH DIFFERENTIAL/PLATELET
Basophils Absolute: 0 10*3/uL (ref 0.0–0.2)
Basos: 0 %
EOS (ABSOLUTE): 0.2 10*3/uL (ref 0.0–0.4)
Eos: 2 %
Hematocrit: 40.8 % (ref 34.0–46.6)
Hemoglobin: 13.2 g/dL (ref 11.1–15.9)
Immature Grans (Abs): 0.1 10*3/uL (ref 0.0–0.1)
Immature Granulocytes: 1 %
Lymphocytes Absolute: 3.3 10*3/uL — ABNORMAL HIGH (ref 0.7–3.1)
Lymphs: 36 %
MCH: 26.9 pg (ref 26.6–33.0)
MCHC: 32.4 g/dL (ref 31.5–35.7)
MCV: 83 fL (ref 79–97)
Monocytes Absolute: 0.6 10*3/uL (ref 0.1–0.9)
Monocytes: 6 %
Neutrophils Absolute: 5.1 10*3/uL (ref 1.4–7.0)
Neutrophils: 55 %
Platelets: 318 10*3/uL (ref 150–450)
RBC: 4.9 x10E6/uL (ref 3.77–5.28)
RDW: 14.3 % (ref 11.7–15.4)
WBC: 9.3 10*3/uL (ref 3.4–10.8)

## 2023-02-20 LAB — COMPREHENSIVE METABOLIC PANEL
ALT: 312 [IU]/L (ref 0–32)
AST: 299 [IU]/L (ref 0–40)
Albumin: 4.3 g/dL (ref 3.9–4.9)
Alkaline Phosphatase: 90 [IU]/L (ref 44–121)
BUN/Creatinine Ratio: 21 (ref 9–23)
BUN: 17 mg/dL (ref 6–20)
Bilirubin Total: 0.3 mg/dL (ref 0.0–1.2)
CO2: 21 mmol/L (ref 20–29)
Calcium: 9.6 mg/dL (ref 8.7–10.2)
Chloride: 97 mmol/L (ref 96–106)
Creatinine, Ser: 0.81 mg/dL (ref 0.57–1.00)
Globulin, Total: 3.1 g/dL (ref 1.5–4.5)
Glucose: 271 mg/dL — ABNORMAL HIGH (ref 70–99)
Potassium: 4.6 mmol/L (ref 3.5–5.2)
Sodium: 135 mmol/L (ref 134–144)
Total Protein: 7.4 g/dL (ref 6.0–8.5)
eGFR: 99 mL/min/{1.73_m2} (ref >59)

## 2023-02-20 LAB — ACUTE HEP PANEL AND HEP B SURFACE AB
Hep A IgM: NEGATIVE
Hep B C IgM: NEGATIVE
Hep C Virus Ab: NONREACTIVE
Hepatitis B Surf Ab Quant: 466 m[IU]/mL
Hepatitis B Surface Ag: NEGATIVE

## 2023-02-20 NOTE — Assessment & Plan Note (Signed)
Recommend start on toujeo 20 U daily. Increase every 3 days until sugars are consistently less than 140.  Start on metformin 500 mg twice a day Start free style libre continuous glucose monitor.

## 2023-02-20 NOTE — Assessment & Plan Note (Signed)
Check labs 

## 2023-02-21 ENCOUNTER — Other Ambulatory Visit: Payer: Self-pay | Admitting: Family Medicine

## 2023-02-21 DIAGNOSIS — R748 Abnormal levels of other serum enzymes: Secondary | ICD-10-CM

## 2023-02-21 DIAGNOSIS — K7581 Nonalcoholic steatohepatitis (NASH): Secondary | ICD-10-CM

## 2023-02-21 NOTE — Assessment & Plan Note (Signed)
Refer to [facility]

## 2023-02-22 ENCOUNTER — Other Ambulatory Visit: Payer: Self-pay

## 2023-02-22 DIAGNOSIS — E1165 Type 2 diabetes mellitus with hyperglycemia: Secondary | ICD-10-CM

## 2023-02-22 MED ORDER — TOUJEO MAX SOLOSTAR 300 UNIT/ML ~~LOC~~ SOPN: PEN_INJECTOR | SUBCUTANEOUS | 1 refills | Status: DC

## 2023-02-23 ENCOUNTER — Ambulatory Visit: Payer: Self-pay | Admitting: Family Medicine

## 2023-02-23 ENCOUNTER — Telehealth: Payer: Self-pay

## 2023-02-23 NOTE — Telephone Encounter (Signed)
Called [facility] and spoke to [place] gave a max dose of 40 units a day and for patient to call office if she has to go over 40 units daily.

## 2023-02-23 NOTE — Telephone Encounter (Signed)
Copied from CRM [id number]. Topic: Clinical - Medication Question >> Feb 23, 2023  8:46 AM Mosetta Putt H wrote: Reason for CRM: pharmacy want to know the max daily dose for insurance purposes insulin glargine, 2 Unit Dial, (TOUJEO MAX SOLOSTAR) 300 UNIT/ML Solostar Pen

## 2023-02-23 NOTE — Telephone Encounter (Signed)
Called patient, she stated that she has talk to [facility] and they have sent her insulin to [facility] because they have not received it yet, so she will pick it up today. Also if I explained to patent that she will need to go up 2 units every three days until her blood sugars are 140 or less. If she has any issue to call office.

## 2023-02-23 NOTE — Telephone Encounter (Signed)
Copied from CRM [id number]. Topic: Clinical - Pink Word Triage >> Feb 23, 2023  9:38 AM Anita Peters wrote: Reason for Triage: Patient blood sugar is 312, has dexcom  Chief Complaint: dm Symptoms: elevated bgl Frequency: constant Pertinent Negatives: Patient denies fever, dizziness, shakey, abd pain Disposition: [] ED /[] Urgent Care (no appt availability in office) / [] Appointment(In office/virtual)/ []  [place]/ [x] Home Care/ [] Refused Recommended Disposition /[]  Mobile Bus/ []  Follow-up with PCP Additional Notes: Patient called in and cannot get bgl down.  States started on insulin but does not have rx yet.  States she also just started metformin.  Patient would like a call back regarding insulin.  Instructed to go to er if becomes worse.   Reason for Disposition  [1] Blood glucose > 300 mg/dL (40.1 mmol/L) AND [0] two or more times in a row  Answer Assessment - Initial Assessment Questions 1. BLOOD GLUCOSE: "What is your blood glucose level?"      300 and above since Monday 2. ONSET: "When did you check the blood glucose?"     Today 5. TYPE 1 or 2:  "Do you know what type of diabetes you have?"  (e.g., Type 1, Type 2, Gestational; doesn't know)      2 6. INSULIN: "Do you take insulin?" "What type of insulin(s) do you use? What is the mode of delivery? (syringe, pen; injection or pump)?"      Not yet waiting for rx and pharmacy to order 7. DIABETES PILLS: "Do you take any pills for your diabetes?" If Yes, ask: "Have you missed taking any pills recently?"     No, started Metformin on Tuesday.  8. OTHER SYMPTOMS: "Do you have any symptoms?" (e.g., fever, frequent urination, difficulty breathing, dizziness, weakness, vomiting)     Frequent urination when she drinks a lot of water.  Protocols used: Diabetes - High Blood Sugar-A-AH

## 2023-02-27 ENCOUNTER — Telehealth: Payer: Self-pay

## 2023-02-27 ENCOUNTER — Other Ambulatory Visit: Payer: Self-pay

## 2023-02-27 DIAGNOSIS — E1165 Type 2 diabetes mellitus with hyperglycemia: Secondary | ICD-10-CM

## 2023-02-27 MED ORDER — PEN NEEDLES 32G X 6 MM MISC: 11 refills | Status: AC

## 2023-02-27 NOTE — Telephone Encounter (Signed)
 Copied from CRM [id number]. Topic: Clinical - Prescription Issue >> Feb 27, 2023 10:55 AM Geroge Baseman wrote: Reason for CRM: Patient needs a prescription for needles that go in here Toujeo pen, this is for her insulin.

## 2023-02-27 NOTE — Telephone Encounter (Signed)
Called patient back made her aware that she can come by office and get sample of dexcom, we have process her order through a DME company and waiting to hear back.

## 2023-02-27 NOTE — Telephone Encounter (Signed)
 Copied from CRM [id number]. Topic: Clinical - Medication Question >> Feb 27, 2023  9:45 AM Fuller Canada P wrote: Reason for CRM: Patient wants to know if medication was approved by insurance, requesting call back Number [phone]

## 2023-03-15 ENCOUNTER — Other Ambulatory Visit: Payer: Self-pay | Admitting: Family Medicine

## 2023-03-15 ENCOUNTER — Ambulatory Visit: Payer: Self-pay | Admitting: Family Medicine

## 2023-03-15 DIAGNOSIS — Z20828 Contact with and (suspected) exposure to other viral communicable diseases: Secondary | ICD-10-CM

## 2023-03-15 MED ORDER — OSELTAMIVIR PHOSPHATE 75 MG PO CAPS: 75.0000 mg | ORAL_CAPSULE | Freq: Two times a day (BID) | ORAL | 0 refills | Status: AC

## 2023-03-15 NOTE — Telephone Encounter (Signed)
  Chief Complaint: flu-like symptoms Symptoms: cough, headache, congestion, sore throat, fever, chills, muscle aches Frequency: began last night Pertinent Negatives: Patient denies SOB, CP Disposition: [] ED /[] Urgent Care (no appt availability in office) / [] Appointment(In office/virtual)/ []  [facility]/ [] Home Care/ [] Refused Recommended Disposition /[] [facility]/ [x]  Follow-up with PCP Additional Notes: Patient calls stating her mom tested positive for flu and now she is symptomatic as of last night. Reports cough, fever, chills, body aches, and headache. Patient is requesting Tami-flu be called in due to medical history. Alerting PCP for review and follow-up. Care advice reviewed, patient verbalized understanding.   Reason for Disposition  [1] Patient is NOT HIGH RISK AND [2] strongly requests antiviral medicine AND [3] flu symptoms present < 48 hours  Answer Assessment - Initial Assessment Questions 1. TYPE of EXPOSURE: "How were you exposed?" (e.g., close contact, not a close contact)     Close contact, mother positive for flu- developed symptoms yesterday 2. DATE of EXPOSURE: "When did the exposure occur?" (e.g., hour, days, weeks)     Live together 3. PREGNANCY: "Is there any chance you are pregnant?" "When was your last menstrual period?"     Denies 4. HIGH RISK for COMPLICATIONS: "Do you have any heart or lung problems?" "Do you have a weakened immune system?" (e.g., CHF, COPD, asthma, HIV positive, chemotherapy, renal failure, diabetes mellitus, sickle cell anemia)     Diabetic 5. SYMPTOMS: "Do you have any symptoms?" (e.g., cough, fever, sore throat, difficulty breathing).     Cough, fever, body aches, chills  Answer Assessment - Initial Assessment Questions 1. WORST SYMPTOM: "What is your worst symptom?" (e.g., cough, runny nose, muscle aches, headache, sore throat, fever)      Cough, fever, muscle aches, headache 2. ONSET: "When did your flu symptoms  start?"      Last night 3. COUGH: "How bad is the cough?"       Pretty consistent, relieved some with inhaler 4. RESPIRATORY DISTRESS: "Describe your breathing."      Normal at this time 5. FEVER: "Do you have a fever?" If Yes, ask: "What is your temperature, how was it measured, and when did it start?"     Feels febrile, has not checked 6. EXPOSURE: "Were you exposed to someone with influenza?"       Yes, mother. 7. FLU VACCINE: "Did you get a flu shot this year?"     Yes 8. HIGH RISK DISEASE: "Do you have any chronic medical problems?" (e.g., heart or lung disease, asthma, weak immune system, or other HIGH RISK conditions)     Diabetes 9. PREGNANCY: "Is there any chance you are pregnant?" "When was your last menstrual period?"     Denies 10. OTHER SYMPTOMS: "Do you have any other symptoms?"  (e.g., runny nose, muscle aches, headache, sore throat)       Cough, headache, muscle aches, chills  Protocols used: Influenza (Flu) Exposure-A-AH, Influenza (Flu) - Seasonal-A-AH

## 2023-03-15 NOTE — Telephone Encounter (Signed)
  1st attempt - no answer, unable to LVM, will continue to attempt.    Copied from CRM [id number]. Topic: Clinical - Pink Word Triage >> Mar 15, 2023  8:41 AM Anita Peters wrote: Reason for Triage: Possible fever (not sure of temp), chills and body aches, some coughing. Started the other night. Mom has the flu so patient feels she has it as well. Requesting for Tamiflu to be sent to the [facility] pharmacy. Please contact at [phone]

## 2023-03-21 ENCOUNTER — Ambulatory Visit: Payer: Commercial Managed Care - PPO | Admitting: Gastroenterology

## 2023-03-27 ENCOUNTER — Ambulatory Visit: Payer: Self-pay | Admitting: Family Medicine

## 2023-03-27 NOTE — Telephone Encounter (Signed)
Copied From CRM [id number]. Reason for Triage: Patient is concerned that her long acting insulin is not enough - she hasn't had anything to eat and her blood sugar is at 312. Patient would like to speak with a nurse to see if they could also prescribe a fast acting insulin as well.    Chief Complaint: hyperglycemia, asymptomatic Symptoms: denies Pertinent Negatives: Patient denies fever, frequent urination, SOB, dizziness, weakness, vomiting, polydipsia, polyphagia Disposition: [] 911 / [] ED /[] Urgent Care (no appt availability in office) / [] Appointment(In office/virtual)/ []  [facility]/ [] Home Care/ [] Refused Recommended Disposition /[] [facility]/ [x]  Follow-up with PCP Additional Notes: Pt reporting she has has been experiencing blood glucose readings >300 more than 2 times in a row, reporting that she had "only water for 15 hours and sugar was 312" this morning, pt reporting her last reading was 356 since eaten, pt reporting she took her meds 10 min ago. Pt reporting she has Dexcom that takes her blood sugar every 15 min. Pt confirms she takes Toujeo and metformin as prescribed. Pt reporting she is supposed to titrate up on Toujeo as needed, reporting she is currently on 36 units of Toujeo and "will go up to 38." Pt reporting that she " feel like may need fast-acting to take after meals or something." Pt confirms no symptoms at all while blood sugar has been elevated to this level, "no symptoms, not excessively hungry," or others. Advised pt follow up with PCP asap, offered appt, pt declines reporting that she just had labs done there. Advised that nurse sending HP message to PCP office for her to receive word back on next steps for her blood sugar, advised pt call back if any symptoms emerge. Pt verbalized understanding.  Reason for Disposition  [1] Blood glucose > 300 mg/dL (36.6 mmol/L) AND [4] two or more times in a row  Answer Assessment - Initial Assessment Questions 1.  BLOOD GLUCOSE: "What is your blood glucose level?"      Blood sugar on Dexcom, 312 before called, eaten since then 356, took meds 10 min ago 3. USUAL RANGE: "What is your glucose level usually?" (e.g., usual fasting morning value, usual evening value)     200s in morning, want it to get to 140 4. KETONES: "Do you check for ketones (urine or blood test strips)?" If Yes, ask: "What does the test show now?"      denies 5. TYPE 1 or 2:  "Do you know what type of diabetes you have?"  (e.g., Type 1, Type 2, Gestational; doesn't know)      Type 2 6. INSULIN: "Do you take insulin?" "What type of insulin(s) do you use? What is the mode of delivery? (syringe, pen; injection or pump)?"      toujeo 7. DIABETES PILLS: "Do you take any pills for your diabetes?" If Yes, ask: "Have you missed taking any pills recently?"     metformin 8. OTHER SYMPTOMS: "Do you have any symptoms?" (e.g., fever, frequent urination, difficulty breathing, dizziness, weakness, vomiting)     Denies, not even excessively thirsty 9. PREGNANCY: "Is there any chance you are pregnant?" "When was your last menstrual period?"     denies  Protocols used: Diabetes - High Blood Sugar-A-AH

## 2023-03-29 NOTE — Telephone Encounter (Signed)
Called patient and informed

## 2023-04-04 ENCOUNTER — Other Ambulatory Visit: Payer: Self-pay | Admitting: Family Medicine

## 2023-04-04 DIAGNOSIS — F902 Attention-deficit hyperactivity disorder, combined type: Secondary | ICD-10-CM

## 2023-04-04 MED ORDER — AMPHETAMINE-DEXTROAMPHETAMINE 10 MG PO TABS: 10.0000 mg | ORAL_TABLET | Freq: Every day | ORAL | 0 refills | Status: DC

## 2023-04-04 NOTE — Telephone Encounter (Signed)
 Copied from CRM [id number]. Topic: Clinical - Medication Refill >> Apr 04, 2023  2:56 PM Delon DASEN wrote: Most Recent Primary Care Visit:  Provider: [facility]  Department: [facility]  Visit Type: OFFICE VISIT  Date: 02/19/2023  Medication: amphetamine -dextroamphetamine  (ADDERALL) 10 MG tablet  Has the patient contacted their pharmacy? No (Agent: If no, request that the patient contact the pharmacy for the refill. If patient does not wish to contact the pharmacy document the reason why and proceed with request.) (Agent: If yes, when and what did the pharmacy advise?)  Is this the correct pharmacy for this prescription? Yes If no, delete pharmacy and type the correct one.  This is the patient's preferred pharmacy:  [facility] - [place], [place] - [address] [place] [address] Phone: [phone] Fax: [phone]    Has the prescription been filled recently? Yes  Is the patient out of the medication? Yes  Has the patient been seen for an appointment in the last year OR does the patient have an upcoming appointment? Yes  Can we respond through MyChart? No  Agent: Please be advised that Rx refills may take up to 3 business days. We ask that you follow-up with your pharmacy.

## 2023-04-17 ENCOUNTER — Other Ambulatory Visit: Payer: Self-pay

## 2023-04-17 DIAGNOSIS — E1165 Type 2 diabetes mellitus with hyperglycemia: Secondary | ICD-10-CM

## 2023-04-17 MED ORDER — TOUJEO MAX SOLOSTAR 300 UNIT/ML ~~LOC~~ SOPN
PEN_INJECTOR | SUBCUTANEOUS | 1 refills | Status: DC
Start: 2023-04-17 — End: 2023-09-10

## 2023-05-09 ENCOUNTER — Other Ambulatory Visit: Payer: Self-pay | Admitting: Family Medicine

## 2023-05-09 DIAGNOSIS — Z3009 Encounter for other general counseling and advice on contraception: Secondary | ICD-10-CM

## 2023-05-09 DIAGNOSIS — F902 Attention-deficit hyperactivity disorder, combined type: Secondary | ICD-10-CM

## 2023-05-09 DIAGNOSIS — E1165 Type 2 diabetes mellitus with hyperglycemia: Secondary | ICD-10-CM

## 2023-05-21 ENCOUNTER — Ambulatory Visit: Payer: Commercial Managed Care - PPO | Admitting: Family Medicine

## 2023-05-21 ENCOUNTER — Other Ambulatory Visit

## 2023-05-21 DIAGNOSIS — E782 Mixed hyperlipidemia: Secondary | ICD-10-CM

## 2023-05-21 DIAGNOSIS — E559 Vitamin D deficiency, unspecified: Secondary | ICD-10-CM

## 2023-05-21 DIAGNOSIS — E1165 Type 2 diabetes mellitus with hyperglycemia: Secondary | ICD-10-CM

## 2023-05-22 ENCOUNTER — Ambulatory Visit (INDEPENDENT_AMBULATORY_CARE_PROVIDER_SITE_OTHER)

## 2023-05-22 VITALS — BP 110/70 | HR 91 | Temp 97.9°F | Ht 63.0 in | Wt 233.0 lb

## 2023-05-22 DIAGNOSIS — E1165 Type 2 diabetes mellitus with hyperglycemia: Secondary | ICD-10-CM

## 2023-05-22 DIAGNOSIS — F902 Attention-deficit hyperactivity disorder, combined type: Secondary | ICD-10-CM

## 2023-05-22 DIAGNOSIS — R748 Abnormal levels of other serum enzymes: Secondary | ICD-10-CM | POA: Diagnosis not present

## 2023-05-22 DIAGNOSIS — E559 Vitamin D deficiency, unspecified: Secondary | ICD-10-CM

## 2023-05-22 DIAGNOSIS — E66813 Obesity, class 3: Secondary | ICD-10-CM

## 2023-05-22 DIAGNOSIS — F411 Generalized anxiety disorder: Secondary | ICD-10-CM

## 2023-05-22 DIAGNOSIS — Z6841 Body Mass Index (BMI) 40.0 and over, adult: Secondary | ICD-10-CM

## 2023-05-22 DIAGNOSIS — E782 Mixed hyperlipidemia: Secondary | ICD-10-CM

## 2023-05-22 LAB — COMPREHENSIVE METABOLIC PANEL
ALT: 144 IU/L — ABNORMAL HIGH (ref 0–32)
AST: 108 IU/L — ABNORMAL HIGH (ref 0–40)
Albumin: 4.3 g/dL (ref 3.9–4.9)
Alkaline Phosphatase: 82 IU/L (ref 44–121)
BUN/Creatinine Ratio: 16 (ref 9–23)
BUN: 13 mg/dL (ref 6–20)
Bilirubin Total: 0.3 mg/dL (ref 0.0–1.2)
CO2: 20 mmol/L (ref 20–29)
Calcium: 9.9 mg/dL (ref 8.7–10.2)
Chloride: 98 mmol/L (ref 96–106)
Creatinine, Ser: 0.79 mg/dL (ref 0.57–1.00)
Globulin, Total: 3.1 g/dL (ref 1.5–4.5)
Glucose: 181 mg/dL — ABNORMAL HIGH (ref 70–99)
Potassium: 4.4 mmol/L (ref 3.5–5.2)
Sodium: 136 mmol/L (ref 134–144)
Total Protein: 7.4 g/dL (ref 6.0–8.5)
eGFR: 101 mL/min/{1.73_m2} (ref >59)

## 2023-05-22 LAB — LIPID PANEL
Chol/HDL Ratio: 7.7 ratio — ABNORMAL HIGH (ref 0.0–4.4)
Cholesterol, Total: 261 mg/dL — ABNORMAL HIGH (ref 100–199)
HDL: 34 mg/dL — ABNORMAL LOW (ref >39)
LDL Chol Calc (NIH): 152 mg/dL — ABNORMAL HIGH (ref 0–99)
Triglycerides: 400 mg/dL — ABNORMAL HIGH (ref 0–149)
VLDL Cholesterol Cal: 75 mg/dL — ABNORMAL HIGH (ref 5–40)

## 2023-05-22 LAB — CBC WITH DIFFERENTIAL/PLATELET
Basophils Absolute: 0.1 10*3/uL (ref 0.0–0.2)
Basos: 0 %
EOS (ABSOLUTE): 0.4 10*3/uL (ref 0.0–0.4)
Eos: 3 %
Hematocrit: 40.2 % (ref 34.0–46.6)
Hemoglobin: 13 g/dL (ref 11.1–15.9)
Immature Grans (Abs): 0 10*3/uL (ref 0.0–0.1)
Immature Granulocytes: 0 %
Lymphocytes Absolute: 4.6 10*3/uL — ABNORMAL HIGH (ref 0.7–3.1)
Lymphs: 35 %
MCH: 26.6 pg (ref 26.6–33.0)
MCHC: 32.3 g/dL (ref 31.5–35.7)
MCV: 82 fL (ref 79–97)
Monocytes Absolute: 0.8 10*3/uL (ref 0.1–0.9)
Monocytes: 6 %
Neutrophils Absolute: 7.5 10*3/uL — ABNORMAL HIGH (ref 1.4–7.0)
Neutrophils: 56 %
Platelets: 321 10*3/uL (ref 150–450)
RBC: 4.89 x10E6/uL (ref 3.77–5.28)
RDW: 14.1 % (ref 11.7–15.4)
WBC: 13.4 10*3/uL — ABNORMAL HIGH (ref 3.4–10.8)

## 2023-05-22 LAB — VITAMIN D 25 HYDROXY (VIT D DEFICIENCY, FRACTURES): Vit D, 25-Hydroxy: 18.5 ng/mL — ABNORMAL LOW (ref 30.0–100.0)

## 2023-05-22 LAB — HEMOGLOBIN A1C
Est. average glucose Bld gHb Est-mCnc: 255 mg/dL
Hgb A1c MFr Bld: 10.5 % — ABNORMAL HIGH (ref 4.8–5.6)

## 2023-05-22 MED ORDER — DAPAGLIFLOZIN PROPANEDIOL 10 MG PO TABS: 10.0000 mg | ORAL_TABLET | Freq: Every day | ORAL | 1 refills | Status: DC

## 2023-05-22 MED ORDER — VITAMIN D (ERGOCALCIFEROL) 1.25 MG (50000 UNIT) PO CAPS: 50000.0000 [IU] | ORAL_CAPSULE | ORAL | 0 refills | Status: DC

## 2023-05-22 MED ORDER — METFORMIN HCL 1000 MG PO TABS: 1000.0000 mg | ORAL_TABLET | Freq: Two times a day (BID) | ORAL | 1 refills | Status: DC

## 2023-05-22 NOTE — Patient Instructions (Signed)
 VISIT SUMMARY:  Today, we discussed your recent blood work results and medication management for your ongoing health conditions, including type 2 diabetes, depression, anxiety, ADHD, and vitamin D deficiency. We addressed the confusion with your medication regimen and made adjustments to better manage your symptoms and overall health.  YOUR PLAN:  -TYPE 2 DIABETES MELLITUS: Type 2 diabetes is a condition where your body does not use insulin properly, leading to high blood sugar levels. We will increase your metformin to 1000 mg twice daily with meals, prescribe Marcelline Deist (pending insurance approval), and maintain your current insulin dose. Please continue to monitor your blood sugar levels and make aggressive lifestyle changes, including diet and exercise. We will repeat your blood work in 3 months.  -METABOLIC ASSOCIATED STEATOHEPATITIS (MASH): MASH is a type of fatty liver disease associated with obesity and diabetes, leading to elevated liver enzymes. Your liver function has improved but remains elevated. We will hold off on Crestor until your liver function improves further and monitor your liver function tests in 3 months.  -HYPERLIPIDEMIA: Hyperlipidemia is having high levels of cholesterol in your blood, which can increase the risk of heart disease. We will hold off on Crestor due to your elevated liver enzymes and encourage dietary modifications and exercise. We will reassess your lipid levels in 3 months.  -DEPRESSION AND ANXIETY: Depression and anxiety are mental health conditions that affect your mood and overall well-being. You will continue taking Celexa 40 mg daily. We will monitor your symptoms and adjust treatment if necessary.  -ATTENTION DEFICIT HYPERACTIVITY DISORDER (ADHD): ADHD is a condition that affects your ability to focus and control impulses. You will continue taking Adderall 10 mg as needed, primarily on school days.  -VITAMIN D DEFICIENCY: Vitamin D deficiency means you  have low levels of vitamin D, which is important for bone health. We will prescribe high-dose vitamin D supplementation once weekly for 12 weeks and encourage daily over-the-counter vitamin D supplementation.  INSTRUCTIONS:  Please follow up in 3 months for repeat blood work and to reassess your liver function and lipid levels. Continue monitoring your blood sugar levels and make the recommended lifestyle changes. If you experience any new symptoms or have concerns about your medications, please contact our office.

## 2023-05-22 NOTE — Progress Notes (Unsigned)
 Subjective:  Patient ID: Anita Peters, female    DOB: 1990-08-07  Age: 33 y.o. MRN: [id number]  No chief complaint on file.   Discussed the use of AI scribe software for clinical note transcription with the patient, who gave verbal consent to proceed.         02/15/2023    8:46 AM 10/19/2022   10:16 AM 05/01/2022    2:45 PM 09/09/2020   12:09 PM 09/09/2020    9:54 AM  Depression screen PHQ 2/9  Decreased Interest 1 1 0 1 0  Down, Depressed, Hopeless 1 1 0 0 0  PHQ - 2 Score 2 2 0 1 0  Altered sleeping 2 1  1    Tired, decreased energy 3 1  2    Change in appetite 1 1  1    Feeling bad or failure about yourself  0 0  1   Trouble concentrating 0 0  1   Moving slowly or fidgety/restless 0 0  0   Suicidal thoughts 0 0  0   PHQ-9 Score 8 5  7    Difficult doing work/chores Somewhat difficult Not difficult at all  Somewhat difficult         10/19/2022   10:16 AM  Fall Risk   Falls in the past year? 0  Number falls in past yr: 0  Injury with Fall? 0  Risk for fall due to : No Fall Risks  Follow up Falls prevention discussed    Patient Care Team: Renne Crigler, FNP as PCP - General (Family Medicine)   Review of Systems  Constitutional:  Negative for chills, fatigue and fever.  HENT:  Negative for congestion, ear pain and sore throat.   Respiratory:  Negative for cough and shortness of breath.   Cardiovascular:  Negative for chest pain.  Gastrointestinal:  Negative for abdominal pain, constipation, diarrhea, nausea and vomiting.  Genitourinary:  Negative for dysuria and frequency.  Musculoskeletal:  Negative for arthralgias and myalgias.  Neurological:  Negative for dizziness and headaches.  Psychiatric/Behavioral:  Negative for dysphoric mood. The patient is not nervous/anxious.     Current Outpatient Medications on File Prior to Visit  Medication Sig Dispense Refill  . amphetamine-dextroamphetamine (ADDERALL) 10 MG tablet Take 1 tablet (10 mg total) by mouth daily with  breakfast. 30 tablet 0  . cariprazine (VRAYLAR) 1.5 MG capsule Take 1 capsule (1.5 mg total) by mouth daily. 30 capsule 0  . citalopram (CELEXA) 40 MG tablet Take 1 tablet (40 mg total) by mouth daily. 30 tablet 3  . Continuous Glucose Sensor (DEXCOM G7 SENSOR) MISC Change every 10 days. 9 each 3  . escitalopram (LEXAPRO) 20 MG tablet Take 1 tablet (20 mg total) by mouth daily. 30 tablet 2  . fluticasone (FLONASE) 50 MCG/ACT nasal spray Place 2 sprays into both nostrils daily. 16 g 6  . insulin glargine, 2 Unit Dial, (TOUJEO MAX SOLOSTAR) 300 UNIT/ML Solostar Pen 20 U daily. Increase by 2 U every 3 days until sugars less than 140. 3 mL 1  . Insulin Pen Needle (PEN NEEDLES) 32G X 6 MM MISC Inject insulin as directed. 100 each 11  . JUNEL FE 1.5/30 1.5-30 MG-MCG tablet Take 1 tablet by mouth daily. 84 tablet 3  . metFORMIN (GLUCOPHAGE) 500 MG tablet Take 1 tablet (500 mg total) by mouth 2 (two) times daily with a meal. 180 tablet 0  . ondansetron (ZOFRAN) 4 MG tablet Take 1 tablet (4 mg total) by mouth every  8 (eight) hours as needed for nausea or vomiting. 40 tablet 0  . pantoprazole (PROTONIX) 40 MG tablet Take 1 tablet (40 mg total) by mouth daily. 30 tablet 6  . rosuvastatin (CRESTOR) 10 MG tablet Take 1 tablet (10 mg total) by mouth daily. 30 tablet 2  . valACYclovir (VALTREX) 500 MG tablet Take 1 tablet (500 mg total) by mouth daily. 30 tablet 6   No current facility-administered medications on file prior to visit.   Past Medical History:  Diagnosis Date  . Antepartum mild preeclampsia 11/14/2016   Formatting of this note might be different from the original. Seen for repeat BP check in triage 12/04/16 Rescue BMZ 10/8 - 10/9  HELLP labs normal/stable 12/16/16, except urine P/C 10.7 Dr. Cyndie Chime spoke with Dr. Particia Nearing (MFM) on 12/18/16 regarding patients clinical care with severe BP on 10/19 (though not 4h apart) and worsening proteinuria with urine P/C of 10.7 (previously 0.3 in August).    . Migraine without aura and without status migrainosus, not intractable 07/02/2019  . Pregnancy induced hypertension   . Rh negative state in antepartum period 06/09/2016  . Short cervix affecting pregnancy 09/26/2016   Past Surgical History:  Procedure Laterality Date  . CESAREAN SECTION  12/20/2016  . CHOLECYSTECTOMY, LAPAROSCOPIC  06/23/2022   Dr Lequita Halt  . DILATION AND CURETTAGE OF UTERUS    . WISDOM TOOTH EXTRACTION      Family History  Problem Relation Age of Onset  . Hyperlipidemia Mother   . Diabetes Mother   . Diabetes Maternal Uncle   . Diabetes Maternal Grandmother    Social History   Socioeconomic History  . Marital status: Single    Spouse name: Not on file  . Number of children: 2  . Years of education: Not on file  . Highest education level: Not on file  Occupational History  . Occupation: CNA  Tobacco Use  . Smoking status: Never  . Smokeless tobacco: Never  Vaping Use  . Vaping status: Never Used  Substance and Sexual Activity  . Alcohol use: Not Currently    Alcohol/week: 2.0 standard drinks of alcohol    Types: 2 Shots of liquor per week    Comment: Every 6 months  . Drug use: No  . Sexual activity: Not Currently    Birth control/protection: None  Other Topics Concern  . Not on file  Social History Narrative  . Not on file   Social Drivers of Health   Financial Resource Strain: Low Risk  (05/01/2022)   Overall Financial Resource Strain (CARDIA)   . Difficulty of Paying Living Expenses: Not hard at all  Food Insecurity: No Food Insecurity (05/01/2022)   Hunger Vital Sign   . Worried About Programme researcher, broadcasting/film/video in the Last Year: Never true   . Ran Out of Food in the Last Year: Never true  Transportation Needs: No Transportation Needs (05/01/2022)   PRAPARE - Transportation   . Lack of Transportation (Medical): No   . Lack of Transportation (Non-Medical): No  Physical Activity: Inactive (05/01/2022)   Exercise Vital Sign   . Days of Exercise per Week:  0 days   . Minutes of Exercise per Session: 0 min  Stress: No Stress Concern Present (05/01/2022)   [employer] of Occupational Health - Occupational Stress Questionnaire   . Feeling of Stress : Not at all  Social Connections: Socially Isolated (05/01/2022)   Social Connection and Isolation Panel [NHANES]   . Frequency of Communication with Friends  and Family: Three times a week   . Frequency of Social Gatherings with Friends and Family: Three times a week   . Attends Religious Services: Never   . Active Member of Clubs or Organizations: No   . Attends Banker Meetings: Never   . Marital Status: Never married    Objective:  There were no vitals taken for this visit.     02/19/2023    9:26 AM 02/15/2023    8:44 AM 10/19/2022    9:57 AM  BP/Weight  Systolic BP 116 124 110  Diastolic BP 72 82 70  Wt. (Lbs) 235 233 239  BMI 41.63 kg/m2 41.27 kg/m2 42.34 kg/m2    Physical Exam  Diabetic Foot Exam - Simple   No data filed      Lab Results  Component Value Date   WBC 13.4 (H) 05/21/2023   HGB 13.0 05/21/2023   HCT 40.2 05/21/2023   PLT 321 05/21/2023   GLUCOSE 181 (H) 05/21/2023   CHOL 261 (H) 05/21/2023   TRIG 400 (H) 05/21/2023   HDL 34 (L) 05/21/2023   LDLCALC 152 (H) 05/21/2023   ALT 144 (H) 05/21/2023   AST 108 (H) 05/21/2023   NA 136 05/21/2023   K 4.4 05/21/2023   CL 98 05/21/2023   CREATININE 0.79 05/21/2023   BUN 13 05/21/2023   CO2 20 05/21/2023   TSH 4.090 02/15/2023   HGBA1C 10.5 (H) 05/21/2023      Assessment & Plan:  Assessment and Plan       There are no diagnoses linked to this encounter.   No orders of the defined types were placed in this encounter.   No orders of the defined types were placed in this encounter.    Follow-up: No follow-ups on file.   I,Candice Gribble,acting as a Neurosurgeon for [employer], MD.,have documented all relevant documentation on the behalf of Windell Moment, MD,as directed by   Windell Moment, MD while in the presence of Windell Moment, MD.   An After Visit Summary was printed and given to the patient.  Windell Moment, MD [facility] [phone]

## 2023-05-23 ENCOUNTER — Telehealth: Payer: Self-pay

## 2023-05-23 NOTE — Telephone Encounter (Signed)
 Called patient to inform her about a spot available for diabetes education, however I was unable to reach patient.

## 2023-05-24 NOTE — Assessment & Plan Note (Signed)
 Hyperlipidemia with total cholesterol at 261 mg/dL. Previously on Crestor, which was held due to elevated liver enzymes. HDL improved from 29 to 34 mg/dL. Not currently on any lipid-lowering therapy. Advised to focus on dietary modifications and exercise. Crestor benefits in preventing heart attacks and strokes outweigh liver enzyme elevation risks, but currently held due to significantly elevated liver enzymes. - Hold Crestor until liver function improves - Encourage dietary modifications and exercise - Reassess lipid levels in 3 months

## 2023-05-24 NOTE — Assessment & Plan Note (Signed)
 Generalized anxiety, previously managed with Lexapro 20 mg. Due to a medication error, switched to Celexa 40 mg, in the same class. Reports feeling about the same on Celexa and wishes to continue current regimen. Depression exacerbated by the death of a close uncle a year ago, but symptoms have improved over time. - Continue Celexa 40 mg daily - Monitor symptoms and adjust treatment if necessary

## 2023-05-24 NOTE — Assessment & Plan Note (Signed)
 ADHD managed with Adderall 10 mg taken as needed, primarily on school days. Cautious about potential addiction and does not take it daily. - Continue Adderall 10 mg as needed for school days

## 2023-05-24 NOTE — Assessment & Plan Note (Signed)
 Vitamin D deficiency with current low levels. Previously on high-dose vitamin D supplementation. - Prescribe high-dose vitamin D supplementation once weekly for 12 weeks - Encourage daily over-the-counter vitamin D supplementation

## 2023-05-24 NOTE — Assessment & Plan Note (Signed)
 Could be MASH vs Statin induced.   Elevated liver enzymes with AST at 108 and ALT at 144, improved from previous levels. Likely due to fatty liver associated with obesity and diabetes. Liver function has improved but remains elevated. - Hold Crestor until liver function improves further - Monitor liver function tests in 3 months

## 2023-05-24 NOTE — Assessment & Plan Note (Signed)
 Continue and intensify lifestyle changes. Started [place] Unfortunately, does not wish to try GLP1 agonists again due to prior adverse effects.

## 2023-05-24 NOTE — Assessment & Plan Note (Signed)
 Poorly controlled type 2 diabetes mellitus with A1c of 10.5%, improved from 11.5%. Blood glucose levels range from 200-400 mg/dL. Currently on metformin 500 mg twice daily and insulin 58 units daily. Reports adverse effects with Ozempic, leading to discontinuation. Considering Farxiga, a non-injection SGLT2 inhibitor, which may aid in blood sugar control and weight loss with potential side effects of yeast or urinary tract infections. Insurance coverage for Anita Peters is uncertain. - Increase metformin to 1000 mg twice daily with meals - Prescribed Farxiga and check insurance coverage - Maintain current insulin dose and adjust based on blood glucose levels - Encourage aggressive lifestyle changes including diet and exercise - Repeat blood work in 3 months

## 2023-06-13 ENCOUNTER — Telehealth: Payer: Self-pay

## 2023-06-13 NOTE — Telephone Encounter (Signed)
 Patient was identified as falling into the [place] Measure - Diabetes.   Patient was: Requires a call back at a later time.  Attempted to reach patient to offer appointment with diabetes educator.  No VM available.  Patient's next scheduled appointment is 09/04/23.

## 2023-07-20 ENCOUNTER — Telehealth: Payer: Self-pay | Admitting: Family Medicine

## 2023-07-20 DIAGNOSIS — F902 Attention-deficit hyperactivity disorder, combined type: Secondary | ICD-10-CM

## 2023-07-20 MED ORDER — AMPHETAMINE-DEXTROAMPHETAMINE 10 MG PO TABS: 10.0000 mg | ORAL_TABLET | Freq: Every day | ORAL | 0 refills | Status: AC

## 2023-07-20 NOTE — Telephone Encounter (Signed)
 Copied from CRM [id number]. Topic: Clinical - Medication Refill >> Jul 20, 2023  8:04 AM Adaline Shenetta wrote: Medication: amphetamine -dextroamphetamine  (ADDERALL) 10 MG tablet  Has the patient contacted their pharmacy? No  This is the patient's preferred pharmacy:  [facility] - [place], [place] - [address] [place] [address] Phone: [phone] Fax: [phone]   Is this the correct pharmacy for this prescription? Yes   Has the prescription been filled recently? Yes  Is the patient out of the medication? Yes  Has the patient been seen for an appointment in the last year OR does the patient have an upcoming appointment? Yes  Can we respond through MyChart? Yes

## 2023-08-02 ENCOUNTER — Other Ambulatory Visit: Payer: Self-pay

## 2023-08-02 DIAGNOSIS — B009 Herpesviral infection, unspecified: Secondary | ICD-10-CM

## 2023-08-02 MED ORDER — VALACYCLOVIR HCL 500 MG PO TABS: 500.0000 mg | ORAL_TABLET | Freq: Every day | ORAL | 6 refills | Status: AC

## 2023-08-02 NOTE — Telephone Encounter (Signed)
 Refill sent to pharmacy.

## 2023-08-17 ENCOUNTER — Other Ambulatory Visit: Payer: Self-pay

## 2023-08-17 DIAGNOSIS — F411 Generalized anxiety disorder: Secondary | ICD-10-CM

## 2023-08-20 MED ORDER — CITALOPRAM HYDROBROMIDE 40 MG PO TABS: 40.0000 mg | ORAL_TABLET | Freq: Every day | ORAL | 3 refills | Status: DC

## 2023-09-04 ENCOUNTER — Ambulatory Visit

## 2023-09-10 ENCOUNTER — Ambulatory Visit: Admitting: Family Medicine

## 2023-09-10 ENCOUNTER — Ambulatory Visit: Admitting: Physician Assistant

## 2023-09-10 ENCOUNTER — Other Ambulatory Visit: Payer: Self-pay

## 2023-09-10 ENCOUNTER — Encounter: Payer: Self-pay | Admitting: Physician Assistant

## 2023-09-10 VITALS — BP 112/82 | HR 126 | Temp 97.8°F | Ht 63.0 in | Wt 225.0 lb

## 2023-09-10 DIAGNOSIS — J029 Acute pharyngitis, unspecified: Secondary | ICD-10-CM

## 2023-09-10 DIAGNOSIS — K219 Gastro-esophageal reflux disease without esophagitis: Secondary | ICD-10-CM

## 2023-09-10 DIAGNOSIS — E782 Mixed hyperlipidemia: Secondary | ICD-10-CM | POA: Diagnosis not present

## 2023-09-10 DIAGNOSIS — F411 Generalized anxiety disorder: Secondary | ICD-10-CM | POA: Diagnosis not present

## 2023-09-10 DIAGNOSIS — Z6841 Body Mass Index (BMI) 40.0 and over, adult: Secondary | ICD-10-CM

## 2023-09-10 DIAGNOSIS — E1165 Type 2 diabetes mellitus with hyperglycemia: Secondary | ICD-10-CM

## 2023-09-10 DIAGNOSIS — E559 Vitamin D deficiency, unspecified: Secondary | ICD-10-CM

## 2023-09-10 DIAGNOSIS — F902 Attention-deficit hyperactivity disorder, combined type: Secondary | ICD-10-CM

## 2023-09-10 DIAGNOSIS — E66813 Obesity, class 3: Secondary | ICD-10-CM | POA: Diagnosis not present

## 2023-09-10 LAB — POCT RAPID STREP A (OFFICE): Rapid Strep A Screen: NEGATIVE

## 2023-09-10 LAB — POCT GLUCOSE FINGERSTICK: Glucose: 156 — AB (ref 70–99)

## 2023-09-10 MED ORDER — DAPAGLIFLOZIN PROPANEDIOL 10 MG PO TABS: 10.0000 mg | ORAL_TABLET | Freq: Every day | ORAL | 1 refills | Status: DC

## 2023-09-10 MED ORDER — TOUJEO MAX SOLOSTAR 300 UNIT/ML ~~LOC~~ SOPN: PEN_INJECTOR | SUBCUTANEOUS | 1 refills | Status: DC

## 2023-09-10 MED ORDER — VITAMIN D (ERGOCALCIFEROL) 1.25 MG (50000 UNIT) PO CAPS
50000.0000 [IU] | ORAL_CAPSULE | ORAL | 0 refills | Status: AC
Start: 2023-09-10 — End: ?

## 2023-09-10 MED ORDER — METFORMIN HCL 1000 MG PO TABS: 1000.0000 mg | ORAL_TABLET | Freq: Two times a day (BID) | ORAL | 1 refills | Status: DC

## 2023-09-10 MED ORDER — INSULIN ASPART 100 UNIT/ML IJ SOLN: 10.0000 [IU] | Freq: Three times a day (TID) | INTRAMUSCULAR | 99 refills | Status: AC

## 2023-09-10 MED ORDER — CITALOPRAM HYDROBROMIDE 40 MG PO TABS: 40.0000 mg | ORAL_TABLET | Freq: Every day | ORAL | 0 refills | Status: AC

## 2023-09-10 MED ORDER — INSULIN SYRINGE-NEEDLE U-100 28G X 5/16" 1 ML MISC: 30.0000 [IU] | Freq: Every day | 3 refills | Status: AC

## 2023-09-10 MED ORDER — PANTOPRAZOLE SODIUM 40 MG PO TBEC: 40.0000 mg | DELAYED_RELEASE_TABLET | Freq: Every day | ORAL | 1 refills | Status: DC

## 2023-09-10 MED ORDER — AMOXICILLIN 875 MG PO TABS: 875.0000 mg | ORAL_TABLET | Freq: Two times a day (BID) | ORAL | 0 refills | Status: DC

## 2023-09-10 MED ORDER — ROSUVASTATIN CALCIUM 10 MG PO TABS: 10.0000 mg | ORAL_TABLET | Freq: Every day | ORAL | 1 refills | Status: AC

## 2023-09-10 NOTE — Progress Notes (Signed)
 Subjective:  Patient ID: Anita Peters, female    DOB: 04/03/1990  Age: 33 y.o. MRN: [id number]  Chief Complaint  Patient presents with   Medical Management of Chronic Issues    HPI:  Discussed the use of AI scribe software for clinical note transcription with the patient, who gave verbal consent to proceed.  History of Present Illness   The patient, with diabetes, presents with a sore throat and suspected strep infection.  They have been experiencing a sore throat for three days, with white patches in the throat and difficulty swallowing. Fever has been present, peaking at 101F, and they have had reduced oral intake due to throat pain and fever. A cough with greenish-brown sputum, occasionally tinged with blood, is noted. No excessive fatigue or abdominal pain is reported.  They have diabetes and have not taken Toujeo  insulin  for about a week due to insurance issues requiring mail order prescriptions. Blood glucose levels have been elevated, with a recent reading of 153 mg/dL. They are on metformin  twice daily and have previously adjusted their Toujeo  dose to 34 units once daily. Concerns about managing blood sugar levels without regular insulin  are expressed. A history of using a Dexcom for glucose monitoring is noted, but it has not been used recently as blood sugars had stabilized.  Socially, they mention having children who are going to first grade and kindergarten. They have not been excessively thirsty except for yesterday, attributed to dehydration from not eating or drinking much.          09/10/2023    2:36 PM 05/22/2023    3:55 PM 02/15/2023    8:46 AM 10/19/2022   10:16 AM 05/01/2022    2:45 PM  Depression screen PHQ 2/9  Decreased Interest 0 0 1 1 0  Down, Depressed, Hopeless 1 0 1 1 0  PHQ - 2 Score 1 0 2 2 0  Altered sleeping 0  2 1   Tired, decreased energy 1  3 1    Change in appetite 1  1 1    Feeling bad or failure about yourself    0 0   Trouble concentrating 0  0  0   Moving slowly or fidgety/restless 1  0 0   Suicidal thoughts 0  0 0   PHQ-9 Score 4  8 5    Difficult doing work/chores Not difficult at all  Somewhat difficult Not difficult at all         09/10/2023    2:36 PM  Fall Risk   Falls in the past year? 0  Number falls in past yr: 0  Injury with Fall? 0  Risk for fall due to : No Fall Risks  Follow up Falls evaluation completed    Patient Care Team: Teressa Harrie HERO, FNP as PCP - General (Family Medicine)   Review of Systems  Constitutional:  Positive for fever. Negative for appetite change and fatigue.  HENT:  Positive for sore throat. Negative for congestion, ear pain and sinus pressure.   Respiratory:  Negative for cough, chest tightness, shortness of breath and wheezing.   Cardiovascular:  Negative for chest pain and palpitations.  Gastrointestinal:  Negative for abdominal pain, constipation, diarrhea, nausea and vomiting.  Genitourinary:  Negative for dysuria and hematuria.  Musculoskeletal:  Negative for arthralgias, back pain, joint swelling and myalgias.  Skin:  Negative for rash.  Neurological:  Negative for dizziness, weakness and headaches.  Psychiatric/Behavioral:  Negative for dysphoric mood. The patient is not nervous/anxious.  Current Outpatient Medications on File Prior to Visit  Medication Sig Dispense Refill   amphetamine -dextroamphetamine  (ADDERALL) 10 MG tablet Take 1 tablet (10 mg total) by mouth daily with breakfast. 30 tablet 0   Continuous Glucose Sensor (DEXCOM G7 SENSOR) MISC Change every 10 days. 9 each 3   Insulin  Pen Needle (PEN NEEDLES) 32G X 6 MM MISC Inject insulin  as directed. 100 each 11   JUNEL FE 1.5/30 1.5-30 MG-MCG tablet Take 1 tablet by mouth daily. 84 tablet 3   ondansetron  (ZOFRAN ) 4 MG tablet Take 1 tablet (4 mg total) by mouth every 8 (eight) hours as needed for nausea or vomiting. 40 tablet 0   valACYclovir  (VALTREX ) 500 MG tablet Take 1 tablet (500 mg total) by mouth daily. 30 tablet  6   No current facility-administered medications on file prior to visit.   Past Medical History:  Diagnosis Date   Antepartum mild preeclampsia 11/14/2016   Formatting of this note might be different from the original. Seen for repeat BP check in triage 12/04/16 Rescue BMZ 10/8 - 10/9  HELLP labs normal/stable 12/16/16, except urine P/C 10.7 Dr. Leontine spoke with Dr. Glendale Right (MFM) on 12/18/16 regarding patients clinical care with severe BP on 10/19 (though not 4h apart) and worsening proteinuria with urine P/C of 10.7 (previously 0.3 in August).    Migraine without aura and without status migrainosus, not intractable 07/02/2019   Pregnancy induced hypertension    Rh negative state in antepartum period 06/09/2016   Short cervix affecting pregnancy 09/26/2016   Past Surgical History:  Procedure Laterality Date   CESAREAN SECTION  12/20/2016   CHOLECYSTECTOMY, LAPAROSCOPIC  06/23/2022   Dr Joesph   DILATION AND CURETTAGE OF UTERUS     WISDOM TOOTH EXTRACTION      Family History  Problem Relation Age of Onset   Hyperlipidemia Mother    Diabetes Mother    Diabetes Maternal Uncle    Diabetes Maternal Grandmother    Social History   Socioeconomic History   Marital status: Single    Spouse name: Not on file   Number of children: 2   Years of education: Not on file   Highest education level: Not on file  Occupational History   Occupation: CNA  Tobacco Use   Smoking status: Never   Smokeless tobacco: Never  Vaping Use   Vaping status: Never Used  Substance and Sexual Activity   Alcohol use: Not Currently    Alcohol/week: 2.0 standard drinks of alcohol    Types: 2 Shots of liquor per week    Comment: Every 6 months   Drug use: No   Sexual activity: Not Currently    Birth control/protection: None  Other Topics Concern   Not on file  Social History Narrative   Not on file   Social Drivers of Health   Financial Resource Strain: Low Risk  (05/01/2022)   Overall Financial  Resource Strain (CARDIA)    Difficulty of Paying Living Expenses: Not hard at all  Food Insecurity: No Food Insecurity (05/01/2022)   Hunger Vital Sign    Worried About Running Out of Food in the Last Year: Never true    Ran Out of Food in the Last Year: Never true  Transportation Needs: No Transportation Needs (05/01/2022)   PRAPARE - Administrator, Civil Service (Medical): No    Lack of Transportation (Non-Medical): No  Physical Activity: Inactive (05/01/2022)   Exercise Vital Sign    Days of Exercise  per Week: 0 days    Minutes of Exercise per Session: 0 min  Stress: No Stress Concern Present (05/01/2022)   [employer] of Occupational Health - Occupational Stress Questionnaire    Feeling of Stress : Not at all  Social Connections: Socially Isolated (05/01/2022)   Social Connection and Isolation Panel    Frequency of Communication with Friends and Family: Three times a week    Frequency of Social Gatherings with Friends and Family: Three times a week    Attends Religious Services: Never    Active Member of Clubs or Organizations: No    Attends Banker Meetings: Never    Marital Status: Never married    Objective:  BP 112/82 (BP Location: Right Arm, Patient Position: Sitting)   Pulse (!) 126   Temp 97.8 F (36.6 C) (Temporal)   Ht 5' 3 (1.6 m)   Wt 225 lb (102.1 kg)   SpO2 97%   BMI 39.86 kg/m      09/10/2023    2:32 PM 05/22/2023    3:52 PM 02/19/2023    9:26 AM  BP/Weight  Systolic BP 112 110 116  Diastolic BP 82 70 72  Wt. (Lbs) 225 233 235  BMI 39.86 kg/m2 41.27 kg/m2 41.63 kg/m2    Physical Exam Vitals reviewed.  Constitutional:      Appearance: Normal appearance.  HENT:     Mouth/Throat:     Pharynx: Oropharyngeal exudate and posterior oropharyngeal erythema present.     Tonsils: Tonsillar exudate present.  Neck:     Vascular: No carotid bruit.  Cardiovascular:     Rate and Rhythm: Normal rate and regular rhythm.     Heart  sounds: Normal heart sounds.  Pulmonary:     Effort: Pulmonary effort is normal.     Breath sounds: Normal breath sounds.  Abdominal:     General: Bowel sounds are normal.     Palpations: Abdomen is soft.     Tenderness: There is no abdominal tenderness.  Neurological:     Mental Status: She is alert and oriented to person, place, and time.  Psychiatric:        Mood and Affect: Mood normal.        Behavior: Behavior normal.         Lab Results  Component Value Date   WBC 23.6 (HH) 09/10/2023   HGB 13.4 09/10/2023   HCT 42.8 09/10/2023   PLT 352 09/10/2023   GLUCOSE 156 (A) 09/10/2023   CHOL 259 (H) 09/10/2023   TRIG 298 (H) 09/10/2023   HDL 35 (L) 09/10/2023   LDLCALC 167 (H) 09/10/2023   ALT 66 (H) 09/10/2023   AST 21 09/10/2023   NA 134 09/10/2023   K 4.7 09/10/2023   CL 97 09/10/2023   CREATININE 0.84 09/10/2023   BUN 10 09/10/2023   CO2 19 (L) 09/10/2023   TSH 4.090 02/15/2023   HGBA1C 7.1 (H) 09/10/2023      Assessment & Plan:  Uncontrolled type 2 diabetes mellitus with hyperglycemia, without long-term current use of insulin  (HCC) Assessment & Plan: Issues obtaining Toujeo  due to insurance changes. Blood glucose 153 mg/dL. Reduced intake may affect glucose levels. Discussed alternative insulin  options and cost issues. - Prescribe Novolog , 10 units before meals, adjust based on glucose readings. - Use Dexcom for glucose monitoring. - Send Toujeo  prescription to mail order pharmacy. - Ensure access to syringes. - Advise hydration with sugar-free electrolyte drinks.  Orders: -  Hemoglobin A1c -     POCT Glucose Fingerstick -     Insulin  Syringe-Needle U-100; 30 Units by Does not apply route daily.  Dispense: 100 each; Refill: 3 -     Insulin  Aspart; Inject 10 Units into the skin 3 (three) times daily before meals.  Dispense: 10 mL; Refill: PRN  Class 3 severe obesity due to excess calories with serious comorbidity and body mass index (BMI) of 40.0 to  44.9 in adult Assessment & Plan: Comorbidity include: Type 2 Diabetes mellitus, Hyperlipidemia Continue to monitor diet and exercise    Mixed hyperlipidemia Assessment & Plan: Uncontrolled No major side effects or issues taking the Crestor  10 mg Labs drawn today  Will adjust medication as needed depending on labs  Orders: -     CBC with Differential/Platelet -     Comprehensive metabolic panel with GFR -     Lipid panel  Generalized anxiety disorder Assessment & Plan: Controlled Continue taking the Celexa  40mg  as prescribed Will continue to monitor symptoms   Attention deficit hyperactivity disorder, combined type Assessment & Plan: Controlled Continue taking Adderall 10 mg. Will monitor symptoms and let us  know if we need to make any adjustments.   Vitamin D  deficiency Assessment & Plan: Continue with current supplementation.     Sore throat Assessment & Plan: High suspicion for bacterial pharyngitis despite negative rapid strep test. Greenish-brown sputum with occasional blood likely from irritation. - Prescribe amoxicillin . - Advise informing their mother to get tested if symptoms persist.  Orders: -     POCT rapid strep A    General Health Maintenance Transitioning medications to mail order pharmacy. Monitoring liver function due to previous elevation. Advised avoiding Tylenol. - Send prescriptions to [facility] mail order pharmacy, excluding Adderall. - Monitor liver function tests to determine Crestor  resumption.  Follow-up Requires follow-up for lab work and medication management. Discussed importance of answering [facility] calls. - Perform lab work today. - Provide work note for past Saturday and Sunday. - Advise answering [facility] calls to establish mail order pharmacy service.     Meds ordered this encounter  Medications   DISCONTD: amoxicillin  (AMOXIL ) 875 MG tablet    Sig: Take 1 tablet (875 mg total) by mouth 2 (two) times daily.    Dispense:  14  tablet    Refill:  0   Insulin  Syringe-Needle U-100 28G X 5/16 1 ML MISC    Sig: 30 Units by Does not apply route daily.    Dispense:  100 each    Refill:  3   insulin  aspart (NOVOLOG ) 100 UNIT/ML injection    Sig: Inject 10 Units into the skin 3 (three) times daily before meals.    Dispense:  10 mL    Refill:  PRN    Orders Placed This Encounter  Procedures   CBC with Differential/Platelet   Comprehensive metabolic panel with GFR   Hemoglobin A1c   Lipid panel   POCT rapid strep A   POCT Glucose Fingerstick     Follow-up: Return in about 3 months (around 12/11/2023) for Chronic.   I,Lauren M Auman,acting as a Neurosurgeon for [employer], PA.,have documented all relevant documentation on the behalf of Anita Angles, PA,as directed by  Anita Angles, PA while in the presence of Anita Peters, [place].   An After Visit Summary was printed and given to the patient.  Anita Peters, [place] [facility] [phone]

## 2023-09-11 ENCOUNTER — Ambulatory Visit: Payer: Self-pay | Admitting: Physician Assistant

## 2023-09-11 ENCOUNTER — Telehealth: Payer: Self-pay

## 2023-09-11 ENCOUNTER — Ambulatory Visit: Payer: Self-pay | Admitting: Family Medicine

## 2023-09-11 LAB — COMPREHENSIVE METABOLIC PANEL WITH GFR
ALT: 66 IU/L — ABNORMAL HIGH (ref 0–32)
AST: 21 IU/L (ref 0–40)
Albumin: 4.5 g/dL (ref 3.9–4.9)
Alkaline Phosphatase: 110 IU/L (ref 44–121)
BUN/Creatinine Ratio: 12 (ref 9–23)
BUN: 10 mg/dL (ref 6–20)
Bilirubin Total: 0.5 mg/dL (ref 0.0–1.2)
CO2: 19 mmol/L — ABNORMAL LOW (ref 20–29)
Calcium: 10.2 mg/dL (ref 8.7–10.2)
Chloride: 97 mmol/L (ref 96–106)
Creatinine, Ser: 0.84 mg/dL (ref 0.57–1.00)
Globulin, Total: 3.6 g/dL (ref 1.5–4.5)
Glucose: 138 mg/dL — ABNORMAL HIGH (ref 70–99)
Potassium: 4.7 mmol/L (ref 3.5–5.2)
Sodium: 134 mmol/L (ref 134–144)
Total Protein: 8.1 g/dL (ref 6.0–8.5)
eGFR: 94 mL/min/1.73 (ref >59)

## 2023-09-11 LAB — CBC WITH DIFFERENTIAL/PLATELET
Basophils Absolute: 0.1 x10E3/uL (ref 0.0–0.2)
Basos: 0 %
EOS (ABSOLUTE): 0.1 x10E3/uL (ref 0.0–0.4)
Eos: 1 %
Hematocrit: 42.8 % (ref 34.0–46.6)
Hemoglobin: 13.4 g/dL (ref 11.1–15.9)
Immature Grans (Abs): 0.3 x10E3/uL — ABNORMAL HIGH (ref 0.0–0.1)
Immature Granulocytes: 1 %
Lymphocytes Absolute: 3.1 x10E3/uL (ref 0.7–3.1)
Lymphs: 13 %
MCH: 24.5 pg — ABNORMAL LOW (ref 26.6–33.0)
MCHC: 31.3 g/dL — ABNORMAL LOW (ref 31.5–35.7)
MCV: 78 fL — ABNORMAL LOW (ref 79–97)
Monocytes Absolute: 1.9 x10E3/uL — ABNORMAL HIGH (ref 0.1–0.9)
Monocytes: 8 %
Neutrophils Absolute: 18.1 x10E3/uL — ABNORMAL HIGH (ref 1.4–7.0)
Neutrophils: 77 %
Platelets: 352 x10E3/uL (ref 150–450)
RBC: 5.47 x10E6/uL — ABNORMAL HIGH (ref 3.77–5.28)
RDW: 14.2 % (ref 11.7–15.4)
WBC: 23.6 x10E3/uL (ref 3.4–10.8)

## 2023-09-11 LAB — HEMOGLOBIN A1C
Est. average glucose Bld gHb Est-mCnc: 157 mg/dL
Hgb A1c MFr Bld: 7.1 % — ABNORMAL HIGH (ref 4.8–5.6)

## 2023-09-11 LAB — LIPID PANEL
Chol/HDL Ratio: 7.4 ratio — ABNORMAL HIGH (ref 0.0–4.4)
Cholesterol, Total: 259 mg/dL — ABNORMAL HIGH (ref 100–199)
HDL: 35 mg/dL — ABNORMAL LOW (ref >39)
LDL Chol Calc (NIH): 167 mg/dL — ABNORMAL HIGH (ref 0–99)
Triglycerides: 298 mg/dL — ABNORMAL HIGH (ref 0–149)
VLDL Cholesterol Cal: 57 mg/dL — ABNORMAL HIGH (ref 5–40)

## 2023-09-11 NOTE — Telephone Encounter (Signed)
 FYI Only or Action Required?: FYI only for provider.  Patient was last seen in primary care on 09/10/2023 by Milon Cleaves, PA.  Called Nurse Triage reporting Sore Throat.  Symptoms began today.  Interventions attempted: Prescription medications: Amoxicillin .  Symptoms are: gradually worsening.  Triage Disposition: See Physician Within 4 Hours (or PCP Triage) (overriding See Physician Within 24 Hours)  Patient/caregiver understands and will follow disposition?: Yes                      Copied from CRM [id number]. Topic: Clinical - Red Word Triage >> Sep 11, 2023 11:48 AM Donna Peters wrote: Anita Peters phone number [phone] Reason for Disposition  [1] Taking antibiotic > 24 hours for strep throat AND [2] sore throat pain is SEVERE  Answer Assessment - Initial Assessment Questions This RN spoke with pt's mom, Anita. Pt was seen in office yesterday and was diagnosed with strep throat. Pt was started on amoxicillin . This RN recommends pt mom takes pt to ED or urgent care today if pt starts having any difficulty swallowing or breathing. Currently, pt does not have any difficulty breathing or swallowing.    1. SYMPTOM: What's the main symptom you're concerned about? (e.g., fever, difficulty swallowing, sore throat)     Uvula is swollen and red 2. ANTIBIOTIC: What antibiotic are you taking? How many times a day?     Amoxicillin , bid 3. ONSET: When was the antibiotic started?     Last night 4. THROAT PAIN:  How bad is the sore throat? (Scale 1-10; mild, moderate or severe)     Pain is better today but the swelling in throat was not there yesterday 5. FEVER: Do you have a fever? If Yes, ask: What is your temperature, how was it measured, and when did it start?     If not taking ibuprofen it comes back 6. OTHER SYMPTOMS: Do you have any other symptoms? (e.g., rash)     No  Protocols used: Strep Throat Infection on Antibiotic Follow-up Call-A-AH

## 2023-09-11 NOTE — Telephone Encounter (Signed)
 [facility] called with alert lab value....  WBC - 23.6

## 2023-09-14 ENCOUNTER — Ambulatory Visit: Admitting: Family Medicine

## 2023-09-15 ENCOUNTER — Encounter: Payer: Self-pay | Admitting: Family Medicine

## 2023-09-15 MED ORDER — AMOXICILLIN-POT CLAVULANATE 875-125 MG PO TABS: 1.0000 | ORAL_TABLET | Freq: Two times a day (BID) | ORAL | 0 refills | Status: AC

## 2023-09-16 ENCOUNTER — Telehealth: Payer: Self-pay | Admitting: Family Medicine

## 2023-09-16 NOTE — Patient Instructions (Signed)
 VISIT SUMMARY:  You came in today with a sore throat and suspected strep infection, along with concerns about managing your diabetes due to issues obtaining your insulin . We discussed your symptoms, prescribed medications, and made plans to address your diabetes management and general health maintenance.  YOUR PLAN:  -PHARYNGITIS: Pharyngitis is an inflammation of the throat, often caused by an infection. Despite a negative rapid strep test, we suspect a bacterial infection due to your symptoms. We have prescribed amoxicillin  to treat the infection. Please inform your mother to get tested if her symptoms persist.  -TYPE 2 DIABETES MELLITUS: Type 2 Diabetes Mellitus is a condition where your body does not use insulin  properly, leading to high blood sugar levels. Due to issues obtaining your usual insulin , we have prescribed Novolog , 10 units before meals, and advised you to adjust the dose based on your glucose readings. Please use your Dexcom for glucose monitoring and stay hydrated with sugar-free electrolyte drinks. We have also sent your Toujeo  prescription to the mail order pharmacy and ensured you have access to syringes.  -GENERAL HEALTH MAINTENANCE: We are transitioning your medications to a mail order pharmacy and monitoring your liver function due to previous elevation. Please avoid using Tylenol. We have sent your prescriptions to [facility] mail order pharmacy, excluding Adderall, and will monitor your liver function tests to determine when you can resume Crestor .  INSTRUCTIONS:  Please follow up for lab work and medication management. We have discussed the importance of answering calls from [facility] to establish your mail order pharmacy service. Lab work will be performed today, and a work note for the past Saturday and Sunday will be provided. Make sure to stay hydrated and monitor your blood glucose levels regularly.

## 2023-09-16 NOTE — Assessment & Plan Note (Signed)
 Controlled Continue taking the Celexa  40mg  as prescribed Will continue to monitor symptoms

## 2023-09-16 NOTE — Assessment & Plan Note (Signed)
Controlled Continue taking Adderall 10 mg. Will monitor symptoms and let us know if we need to make any adjustments.

## 2023-09-16 NOTE — Assessment & Plan Note (Signed)
 High suspicion for bacterial pharyngitis despite negative rapid strep test. Greenish-brown sputum with occasional blood likely from irritation. - Prescribe amoxicillin . - Advise informing their mother to get tested if symptoms persist.

## 2023-09-16 NOTE — Telephone Encounter (Signed)
 Patient called and stated that she was diagnosed with strep throat 6 days ago and her antibiotic was not helping. Sent in Augmentin  to her pharmacy.

## 2023-09-16 NOTE — Assessment & Plan Note (Signed)
 Comorbidity include: Type 2 Diabetes mellitus, Hyperlipidemia Continue to monitor diet and exercise

## 2023-09-16 NOTE — Assessment & Plan Note (Signed)
Uncontrolled No major side effects or issues taking the Crestor 10 mg Labs drawn today  Will adjust medication as needed depending on labs

## 2023-09-16 NOTE — Assessment & Plan Note (Signed)
 Issues obtaining Toujeo  due to insurance changes. Blood glucose 153 mg/dL. Reduced intake may affect glucose levels. Discussed alternative insulin  options and cost issues. - Prescribe Novolog , 10 units before meals, adjust based on glucose readings. - Use Dexcom for glucose monitoring. - Send Toujeo  prescription to mail order pharmacy. - Ensure access to syringes. - Advise hydration with sugar-free electrolyte drinks.

## 2023-09-16 NOTE — Assessment & Plan Note (Signed)
Continue with current supplementation

## 2023-09-26 ENCOUNTER — Other Ambulatory Visit

## 2023-09-26 ENCOUNTER — Other Ambulatory Visit: Payer: Self-pay

## 2023-09-26 DIAGNOSIS — R7989 Other specified abnormal findings of blood chemistry: Secondary | ICD-10-CM

## 2023-09-27 LAB — ACUTE VIRAL HEPATITIS (HAV, HBV, HCV)
HCV Ab: NONREACTIVE
Hep A IgM: NEGATIVE
Hep B C IgM: NEGATIVE
Hepatitis B Surface Ag: NEGATIVE

## 2023-09-27 LAB — HCV INTERPRETATION

## 2023-10-01 ENCOUNTER — Telehealth: Payer: Self-pay | Admitting: Family Medicine

## 2023-10-01 NOTE — Telephone Encounter (Signed)
 [facility] [phone]

## 2023-10-02 ENCOUNTER — Ambulatory Visit: Payer: Self-pay | Admitting: Physician Assistant

## 2023-10-03 ENCOUNTER — Other Ambulatory Visit: Payer: Self-pay | Admitting: Family Medicine

## 2023-10-03 DIAGNOSIS — E1165 Type 2 diabetes mellitus with hyperglycemia: Secondary | ICD-10-CM

## 2023-10-03 DIAGNOSIS — K219 Gastro-esophageal reflux disease without esophagitis: Secondary | ICD-10-CM

## 2023-10-03 MED ORDER — PANTOPRAZOLE SODIUM 40 MG PO TBEC: 40.0000 mg | DELAYED_RELEASE_TABLET | Freq: Every day | ORAL | 1 refills | Status: AC

## 2023-10-03 MED ORDER — METFORMIN HCL 1000 MG PO TABS: 1000.0000 mg | ORAL_TABLET | Freq: Two times a day (BID) | ORAL | 1 refills | Status: AC

## 2023-10-03 MED ORDER — TOUJEO MAX SOLOSTAR 300 UNIT/ML ~~LOC~~ SOPN: PEN_INJECTOR | SUBCUTANEOUS | 1 refills | Status: AC

## 2023-10-03 MED ORDER — DAPAGLIFLOZIN PROPANEDIOL 10 MG PO TABS: 10.0000 mg | ORAL_TABLET | Freq: Every day | ORAL | 1 refills | Status: AC

## 2023-10-03 NOTE — Telephone Encounter (Signed)
 Copied from CRM #[id number]. Topic: Clinical - Medication Refill >> Oct 03, 2023  9:39 AM Sophia H wrote: Medication: metFORMIN  (GLUCOPHAGE ) 1000 MG tablet dapagliflozin  propanediol (FARXIGA ) 10 MG TABS tablet  pantoprazole  (PROTONIX ) 40 MG tablet insulin  glargine, 2 Unit Dial, (TOUJEO  MAX SOLOSTAR) 300 UNIT/ML Solostar Pen   Has the patient contacted their pharmacy? Yes, was told that the pharmacy reached out twice now.  (Agent: If no, request that the patient contact the pharmacy for the refill. If patient does not wish to contact the pharmacy document the reason why and proceed with request.) (Agent: If yes, when and what did the pharmacy advise?)  This is the patient's preferred pharmacy:   [facility] - [place], [place] - [address] [place] [address] Phone: [phone] Fax: [phone]  Is this the correct pharmacy for this prescription? Yes If no, delete pharmacy and type the correct one.   Has the prescription been filled recently? Yes  Is the patient out of the medication? Yes, needing fills ASAP.   Has the patient been seen for an appointment in the last year OR does the patient have an upcoming appointment? Yes, seen July 30th.   Can we respond through MyChart? No, please call.   Agent: Please be advised that Rx refills may take up to 3 business days. We ask that you follow-up with your pharmacy.

## 2023-10-04 ENCOUNTER — Telehealth: Payer: Self-pay

## 2023-10-04 NOTE — Telephone Encounter (Signed)
 Copied from CRM [id number]. Topic: Clinical - Lab/Test Results >> Oct 04, 2023 12:05 PM Montie POUR wrote: Reason for CRM:  I read her this: Hepatitis negative We will check on your liver enzymes at your next follow up.  She had no questions at this time

## 2023-10-05 ENCOUNTER — Telehealth: Payer: Self-pay | Admitting: Family Medicine

## 2023-10-05 NOTE — Telephone Encounter (Signed)
 Called pharmacy and I gave them the max dose that it is 50 Units per Dr Sherre.  They will update the information

## 2023-10-05 NOTE — Telephone Encounter (Signed)
 Copied from CRM [id number]. Topic: Clinical - Medication Question >> Oct 05, 2023  9:53 AM Donee H wrote: Reason for RMF:Jojwj from [facility] pharmacy called regarding medication insulin  glargine, 2 Unit Dial, (TOUJEO  MAX SOLOSTAR) 300 UNIT/ML Solostar Pen.  Would like to know the correct max dose for the patient. Pharmacy states instructions states patient should take 20 U daily. Increase by 2 U every 3 days until sugars less than 140. But received different instructions via fax. Would like clearly instructions or want to know if this need to be left off of bottle. Callback number  [phone] Alana

## 2023-10-15 ENCOUNTER — Ambulatory Visit: Payer: Self-pay

## 2023-10-15 NOTE — Telephone Encounter (Signed)
 Reason for Disposition . Abnormal color vaginal discharge (i.e., yellow, green, gray)  Answer Assessment - Initial Assessment Questions 1. DISCHARGE: Describe the discharge. (e.g., white, yellow, green, gray, foamy, cottage cheese-like)     No discharge at present times comes and goes 2. ODOR: Is there a bad odor?     no 3. ONSET: When did the discharge begin?     Off and on x 1 month 4. RASH: Is there a rash in the genital area? If Yes, ask: Describe it. (e.g., redness, blisters, sores, bumps)   Yes red 5. ABDOMEN PAIN: Are you having any abdomen pain? If Yes, ask: What does it feel like?  (e.g., crampy, dull, intermittent, constant)      no 6. ABDOMEN PAIN SEVERITY: If present, ask: How bad is it? (e.g., Scale 1-10; mild, moderate, or severe)     no 7. CAUSE: What do you think is causing the discharge? Have you had the same problem before? What happened then?     unknown 8. OTHER SYMPTOMS: Do you have any other symptoms? (e.g., fever, itching, urination pain, vaginal bleeding, vaginal foreign body)     Itching,  9. PREGNANCY: Is there any chance you are pregnant? When was your last menstrual period?     no  Pt states the vaginal dryness then its itchy around vulva  Protocols used: Vaginal Discharge-A-AH

## 2023-10-15 NOTE — Telephone Encounter (Signed)
Unable to reach pt x3 attempts 

## 2023-10-15 NOTE — Telephone Encounter (Signed)
 FYI Only or Action Required?: FYI only for provider.  Patient was last seen in primary care on 09/10/2023 by Milon Cleaves, PA.  Called Nurse Triage reporting Vaginal Discharge.  Symptoms began on and off x 1 month.  Interventions attempted: OTC medications: monostat.  Symptoms are: gradually worsening.  Triage Disposition: See PCP When Office is Open (Within 3 Days), No Contact Calls  Patient/caregiver understands and will follow disposition?: Yes

## 2023-10-15 NOTE — Telephone Encounter (Signed)
 Patient called, no answer, mailbox is full.    Summary: Vaginal discharge   Vaginal discharge.

## 2023-10-15 NOTE — Telephone Encounter (Signed)
2nd attempt, mailbox is full

## 2023-10-16 ENCOUNTER — Ambulatory Visit

## 2023-11-29 ENCOUNTER — Telehealth (INDEPENDENT_AMBULATORY_CARE_PROVIDER_SITE_OTHER): Admitting: Family Medicine

## 2023-11-29 ENCOUNTER — Encounter: Payer: Self-pay | Admitting: Family Medicine

## 2023-11-29 DIAGNOSIS — N898 Other specified noninflammatory disorders of vagina: Secondary | ICD-10-CM | POA: Diagnosis not present

## 2023-11-29 MED ORDER — ESTRADIOL 10 MCG VA TABS: 5.0000 ug | ORAL_TABLET | VAGINAL | 0 refills | Status: AC

## 2023-11-29 MED ORDER — NYSTATIN 100000 UNIT/GM EX CREA: 1.0000 | TOPICAL_CREAM | Freq: Two times a day (BID) | CUTANEOUS | 0 refills | Status: AC

## 2023-11-29 MED ORDER — FLUCONAZOLE 150 MG PO TABS: 150.0000 mg | ORAL_TABLET | Freq: Every day | ORAL | 0 refills | Status: AC

## 2023-11-29 NOTE — Progress Notes (Signed)
 Virtual Visit via Video Note   This visit type was conducted per patient request This format is felt to be appropriate for this patient at this time.  All issues noted in this document were discussed and addressed.  A limited physical exam was performed with this format.  A verbal consent was obtained for the virtual visit.   Date:  11/29/2023   ID:  Anita Peters, DOB 1990/07/16, MRN [id number]  Patient Location: Home Provider Location: Office/Clinic  PCP:  Teressa Harrie HERO, FNP   Chief Complaint  Patient presents with   vaginal dryness     History of Present Illness:    Discussed the use of AI scribe software for clinical note transcription with the patient, who gave verbal consent to proceed.  History of Present Illness   Anita Peters is a 33 year old female who presents with vaginal dryness and itching.  Vaginal dryness and pruritus - Vaginal dryness and itching began recently  - Reports that it is so bad that the skin will bleed and she has to pad dry. - Symptoms are similar to a previous skin condition under her arms, which was treated with a fungal powder - Recent antibiotic use      Past Medical History:  Diagnosis Date   Antepartum mild preeclampsia 11/14/2016   Formatting of this note might be different from the original. Seen for repeat BP check in triage 12/04/16 Rescue BMZ 10/8 - 10/9  HELLP labs normal/stable 12/16/16, except urine P/C 10.7 Dr. Leontine spoke with Dr. Glendale Right (MFM) on 12/18/16 regarding patients clinical care with severe BP on 10/19 (though not 4h apart) and worsening proteinuria with urine P/C of 10.7 (previously 0.3 in August).    Migraine without aura and without status migrainosus, not intractable 07/02/2019   Pregnancy induced hypertension    Rh negative state in antepartum period 06/09/2016   Short cervix affecting pregnancy 09/26/2016    Past Surgical History:  Procedure Laterality Date   CESAREAN SECTION  12/20/2016   CHOLECYSTECTOMY,  LAPAROSCOPIC  06/23/2022   Dr Joesph   DILATION AND CURETTAGE OF UTERUS     WISDOM TOOTH EXTRACTION      Family History  Problem Relation Age of Onset   Hyperlipidemia Mother    Diabetes Mother    Diabetes Maternal Uncle    Diabetes Maternal Grandmother     Social History   Socioeconomic History   Marital status: Single    Spouse name: Not on file   Number of children: 2   Years of education: Not on file   Highest education level: Not on file  Occupational History   Occupation: CNA  Tobacco Use   Smoking status: Never   Smokeless tobacco: Never  Vaping Use   Vaping status: Never Used  Substance and Sexual Activity   Alcohol use: Not Currently    Alcohol/week: 2.0 standard drinks of alcohol    Types: 2 Shots of liquor per week    Comment: Every 6 months   Drug use: No   Sexual activity: Not Currently    Birth control/protection: None  Other Topics Concern   Not on file  Social History Narrative   Not on file   Social Drivers of Health   Financial Resource Strain: Patient Declined (11/28/2023)   Overall Financial Resource Strain (CARDIA)    Difficulty of Paying Living Expenses: Patient declined  Food Insecurity: Patient Declined (11/28/2023)   Hunger Vital Sign    Worried About Running Out of  Food in the Last Year: Patient declined    Ran Out of Food in the Last Year: Patient declined  Transportation Needs: Patient Declined (11/28/2023)   PRAPARE - Administrator, Civil Service (Medical): Patient declined    Lack of Transportation (Non-Medical): Patient declined  Physical Activity: Inactive (05/01/2022)   Exercise Vital Sign    Days of Exercise per Week: 0 days    Minutes of Exercise per Session: 0 min  Stress: No Stress Concern Present (05/01/2022)   [employer] of Occupational Health - Occupational Stress Questionnaire    Feeling of Stress : Not at all  Social Connections: Unknown (11/28/2023)   Social Connection and Isolation Panel     Frequency of Communication with Friends and Family: More than three times a week    Frequency of Social Gatherings with Friends and Family: Patient declined    Attends Religious Services: Patient declined    Database administrator or Organizations: Patient declined    Attends Banker Meetings: Not on file    Marital Status: Patient declined  Intimate Partner Violence: Not At Risk (05/01/2022)   Humiliation, Afraid, Rape, and Kick questionnaire    Fear of Current or Ex-Partner: No    Emotionally Abused: No    Physically Abused: No    Sexually Abused: No    Outpatient Medications Prior to Visit  Medication Sig Dispense Refill   amphetamine -dextroamphetamine  (ADDERALL) 10 MG tablet Take 1 tablet (10 mg total) by mouth daily with breakfast. 30 tablet 0   citalopram  (CELEXA ) 40 MG tablet Take 1 tablet (40 mg total) by mouth daily. 90 tablet 0   Continuous Glucose Sensor (DEXCOM G7 SENSOR) MISC Change every 10 days. 9 each 3   dapagliflozin  propanediol (FARXIGA ) 10 MG TABS tablet Take 1 tablet (10 mg total) by mouth daily before breakfast. 90 tablet 1   insulin  aspart (NOVOLOG ) 100 UNIT/ML injection Inject 10 Units into the skin 3 (three) times daily before meals. 10 mL PRN   insulin  glargine, 2 Unit Dial, (TOUJEO  MAX SOLOSTAR) 300 UNIT/ML Solostar Pen 20 U daily. Increase by 2 U every 3 days until sugars less than 140. 3 mL 1   Insulin  Pen Needle (PEN NEEDLES) 32G X 6 MM MISC Inject insulin  as directed. 100 each 11   Insulin  Syringe-Needle U-100 28G X 5/16 1 ML MISC 30 Units by Does not apply route daily. 100 each 3   metFORMIN  (GLUCOPHAGE ) 1000 MG tablet Take 1 tablet (1,000 mg total) by mouth 2 (two) times daily with a meal. 180 tablet 1   ondansetron  (ZOFRAN ) 4 MG tablet Take 1 tablet (4 mg total) by mouth every 8 (eight) hours as needed for nausea or vomiting. 40 tablet 0   pantoprazole  (PROTONIX ) 40 MG tablet Take 1 tablet (40 mg total) by mouth daily. 90 tablet 1    rosuvastatin  (CRESTOR ) 10 MG tablet Take 1 tablet (10 mg total) by mouth daily. 90 tablet 1   valACYclovir  (VALTREX ) 500 MG tablet Take 1 tablet (500 mg total) by mouth daily. 30 tablet 6   Vitamin D , Ergocalciferol , (DRISDOL ) 1.25 MG (50000 UNIT) CAPS capsule Take 1 capsule (50,000 Units total) by mouth every 7 (seven) days. 12 capsule 0   JUNEL FE 1.5/30 1.5-30 MG-MCG tablet Take 1 tablet by mouth daily. 84 tablet 3   No facility-administered medications prior to visit.    No Known Allergies   Social History   Tobacco Use   Smoking status: Never  Smokeless tobacco: Never  Vaping Use   Vaping status: Never Used  Substance Use Topics   Alcohol use: Not Currently    Alcohol/week: 2.0 standard drinks of alcohol    Types: 2 Shots of liquor per week    Comment: Every 6 months   Drug use: No     Review of Systems  Constitutional:  Negative for chills, fever and malaise/fatigue.  HENT:  Negative for congestion, sinus pain and sore throat.           Respiratory:  Negative for cough and shortness of breath.   Cardiovascular:  Negative for chest pain.  Gastrointestinal:  Negative for diarrhea, nausea and vomiting.  Genitourinary:        Vaginal dryness and itching  Neurological:  Negative for dizziness and headaches.  Psychiatric/Behavioral:  Negative for depression. The patient is not nervous/anxious.      Labs/Other Tests and Data Reviewed:    Recent Labs: 02/15/2023: TSH 4.090 09/10/2023: ALT 66; BUN 10; Creatinine, Ser 0.84; Hemoglobin 13.4; Platelets 352; Potassium 4.7; Sodium 134   Recent Lipid Panel Lab Results  Component Value Date/Time   CHOL 259 (H) 09/10/2023 03:27 PM   TRIG 298 (H) 09/10/2023 03:27 PM   HDL 35 (L) 09/10/2023 03:27 PM   CHOLHDL 7.4 (H) 09/10/2023 03:27 PM   LDLCALC 167 (H) 09/10/2023 03:27 PM    Wt Readings from Last 3 Encounters:  09/10/23 225 lb (102.1 kg)  05/22/23 233 lb (105.7 kg)  02/19/23 235 lb (106.6 kg)     Objective:     Vital Signs:  There were no vitals taken for this visit.   Physical Exam Vitals reviewed.  Constitutional:      General: She is not in acute distress.    Appearance: Normal appearance. She is not ill-appearing.  Eyes:     Conjunctiva/sclera: Conjunctivae normal.  Pulmonary:     Effort: Pulmonary effort is normal.  Neurological:     Mental Status: She is alert and oriented to person, place, and time. Mental status is at baseline.  Psychiatric:        Mood and Affect: Mood normal.        Behavior: Behavior normal.        Thought Content: Thought content normal.      ASSESSMENT & PLAN:   Vaginal itching Assessment & Plan: Vaginal pruritus Differentials include yeast, low estrogen levels or stress and anxiety. Patient recently prescribed 2 rounds of antibiotics. - Prescribed Diflucan (fluconazole) oral tablet. - Prescribed nystatin cream for external application. - Prescribed estrogen suppository. - Advised follow-up if no improvement.   Orders: -     Fluconazole; Take 1 tablet (150 mg total) by mouth daily for 1 dose.  Dispense: 1 tablet; Refill: 0 -     Nystatin; Apply 1 Application topically 2 (two) times daily.  Dispense: 30 g; Refill: 0  Vaginal dryness Assessment & Plan: Vaginal dryness Differentials include yeast, low estrogen levels or stress and anxiety. Patient recently stopped taking her birth control pills - Prescribed estrogen suppository. - Advised follow-up if no improvement.   Orders: -     Estradiol; Place 0.5 tablets (5 mcg total) vaginally once a week.  Dispense: 8 tablet; Refill: 0    Meds ordered this encounter  Medications   fluconazole (DIFLUCAN) 150 MG tablet    Sig: Take 1 tablet (150 mg total) by mouth daily for 1 dose.    Dispense:  1 tablet    Refill:  0  Estradiol 10 MCG TABS vaginal tablet    Sig: Place 0.5 tablets (5 mcg total) vaginally once a week.    Dispense:  8 tablet    Refill:  0   nystatin cream (MYCOSTATIN)    Sig:  Apply 1 Application topically 2 (two) times daily.    Dispense:  30 g    Refill:  0     Follow Up:  In Person prn  Signed, Harrie Cedar, FNP [facility]  [phone]

## 2023-11-29 NOTE — Assessment & Plan Note (Signed)
 Vaginal dryness Differentials include yeast, low estrogen levels or stress and anxiety. Patient recently stopped taking her birth control pills - Prescribed estrogen suppository. - Advised follow-up if no improvement.

## 2023-11-29 NOTE — Assessment & Plan Note (Addendum)
 Vaginal pruritus Differentials include yeast, low estrogen levels or stress and anxiety. Patient recently prescribed 2 rounds of antibiotics. - Prescribed Diflucan (fluconazole) oral tablet. - Prescribed nystatin cream for external application. - Prescribed estrogen suppository. - Advised follow-up if no improvement.

## 2023-12-17 ENCOUNTER — Encounter: Payer: Self-pay | Admitting: Family Medicine

## 2023-12-17 ENCOUNTER — Ambulatory Visit (INDEPENDENT_AMBULATORY_CARE_PROVIDER_SITE_OTHER): Admitting: Family Medicine

## 2023-12-17 VITALS — BP 102/68 | HR 81 | Temp 98.0°F | Resp 16 | Ht 63.0 in | Wt 231.4 lb

## 2023-12-17 DIAGNOSIS — B372 Candidiasis of skin and nail: Secondary | ICD-10-CM | POA: Diagnosis not present

## 2023-12-17 DIAGNOSIS — E1165 Type 2 diabetes mellitus with hyperglycemia: Secondary | ICD-10-CM

## 2023-12-17 DIAGNOSIS — E782 Mixed hyperlipidemia: Secondary | ICD-10-CM

## 2023-12-17 LAB — POCT GLYCOSYLATED HEMOGLOBIN (HGB A1C): HbA1c POC (<> result, manual entry): 7.1 % (ref 4.0–5.6)

## 2023-12-17 LAB — POCT LIPID PANEL
Glucose: 177
HDL: 35
LDL: 166
Non-HDL: 205
TC/HDL: 4.7
TC: 241
TRG: 197

## 2023-12-17 MED ORDER — FLUCONAZOLE 150 MG PO TABS: 150.0000 mg | ORAL_TABLET | Freq: Every day | ORAL | 0 refills | Status: AC

## 2023-12-17 NOTE — Assessment & Plan Note (Signed)
 BMI 40.99, not at goal Has gained 6 pounds since last visit.  - Continue to eat healthy - Aim to do some physical activity for 150 minutes per week. This is typically divided into 5 days per week, 30 minutes per day. The activity should be enough to get your heart rate up. Anything is better than nothing if you have time constraints.

## 2023-12-17 NOTE — Assessment & Plan Note (Addendum)
 Improved  Not resolved. She has tried several topical treatments with some improvement.  - Reports the Farxiga  is most likely the cause of recent flare. Orders:   fluconazole (DIFLUCAN) 150 MG tablet; Take 1 tablet (150 mg total) by mouth daily for 3 doses.

## 2023-12-17 NOTE — Assessment & Plan Note (Addendum)
 Uncontrolled Last lipids Lab Results  Component Value Date   CHOL 259 (H) 09/10/2023   HDL 35 (L) 09/10/2023   LDLCALC 167 (H) 09/10/2023   TRIG 298 (H) 09/10/2023   CHOLHDL 7.4 (H) 09/10/2023    - No major side effects or issues taking the Crestor  10 mg but reports that she stopped taking medication - Labs drawn today, Await labs/testing for assessment and recommendations Orders:   POCT Lipid Panel   Comprehensive metabolic panel with GFR

## 2023-12-17 NOTE — Assessment & Plan Note (Addendum)
 Improved Control: good Recommend check sugars fasting daily. Recommend check feet daily. Recommend annual eye exams. Medicines: Continue Novolog , 10 units before meals, adjust based on glucose readings and Toujeo  20 units daily Continue to work on eating a healthy diet and exercise. Use Dexcom for glucose monitoring. Labs drawn today.    Lab Results  Component Value Date   HGBA1C 7.1 12/17/2023   Orders:   POCT glycosylated hemoglobin (Hb A1C)   Microalbumin / creatinine urine ratio

## 2023-12-17 NOTE — Progress Notes (Signed)
 Subjective:  Patient ID: Anita Peters, female    DOB: 1990-10-02  Age: 33 y.o. MRN: [id number]  Chief Complaint  Patient presents with   Medical Management of Chronic Issues   HPI  Diabetes:  Last seen for diabetes 3 months ago.  Management since then includes Metformin  1000 mg TWICE A DAY, discontinued Ozempic . She reports good compliance with treatment. She is having side effects. Yeast infections and candida intertrigo (under arms) She is check feet daily. Episodes of hypoglycemia? No Current insulin  regiment:  insulin  glargine (Toujeo  - 20 units daily), Novolog  inject 10 units before meals three times daily Most Recent Eye Exam: due Current exercise: no regular exercise in general, a healthy diet  , :1Current exercise: work and houseworkCurrent diet habits: in general, a healthy diet  Pertinent Labs: Lab Results  Component Value Date   CHOL 259 (H) 09/10/2023   HDL 35 (L) 09/10/2023   LDLCALC 167 (H) 09/10/2023   TRIG 298 (H) 09/10/2023   CHOLHDL 7.4 (H) 09/10/2023   Lab Results  Component Value Date   NA 134 09/10/2023   K 4.7 09/10/2023   CREATININE 0.84 09/10/2023   EGFR 94 09/10/2023   GLUCOSE 156 (A) 09/10/2023       Hyperlipidemia: Stopped taking her Crestor  10 mg. Eats a healthy diet except when she is on her period.  Elevated liver enzymes: Patient reports that her liver enzymes were elevated due to the devil's medicine, Ozempic . Once she stopped taking that medication her levels started to normalize.  Depression: Stopped taking her anxiety and depression medication. Manages well with good coping skills.  ADHD: Stopped taking Adderall 10 mg. She is managing without the medication at this time.   Candida intertrigo: Has been having issues with yeast rash under her arms. She has tried powders and creams and nothing seems to work.     12/17/2023    8:08 AM 09/10/2023    2:36 PM 05/22/2023    3:55 PM 02/15/2023    8:46 AM 10/19/2022   10:16 AM   Depression screen PHQ 2/9  Decreased Interest 1 0 0 1 1  Down, Depressed, Hopeless 0 1 0 1 1  PHQ - 2 Score 1 1 0 2 2  Altered sleeping 1 0  2 1  Tired, decreased energy 1 1  3 1   Change in appetite 1 1  1 1   Feeling bad or failure about yourself  0   0 0  Trouble concentrating 0 0  0 0  Moving slowly or fidgety/restless 0 1  0 0  Suicidal thoughts 0 0  0 0  PHQ-9 Score 4 4  8 5   Difficult doing work/chores Not difficult at all Not difficult at all  Somewhat difficult Not difficult at all        12/17/2023    8:08 AM  Fall Risk   Falls in the past year? 0  Number falls in past yr: 0  Injury with Fall? 0  Risk for fall due to : No Fall Risks  Follow up Falls evaluation completed    Patient Care Team: Teressa Harrie HERO, FNP as PCP - General (Family Medicine)   Review of Systems  Constitutional:  Negative for chills, diaphoresis, fatigue and fever.  HENT:  Negative for congestion, ear pain and sinus pain.   Eyes: Negative.   Respiratory:  Negative for cough and shortness of breath.   Cardiovascular:  Negative for chest pain.  Gastrointestinal:  Negative for abdominal pain, constipation,  diarrhea, nausea and vomiting.  Endocrine: Negative.   Genitourinary:  Negative for dysuria, frequency and urgency.  Musculoskeletal:  Negative for arthralgias.  Allergic/Immunologic: Negative.   Neurological:  Negative for dizziness, weakness, light-headedness and headaches.  Hematological: Negative.   Psychiatric/Behavioral:  Negative for dysphoric mood. The patient is not nervous/anxious.     Current Outpatient Medications on File Prior to Visit  Medication Sig Dispense Refill   dapagliflozin  propanediol (FARXIGA ) 10 MG TABS tablet Take 1 tablet (10 mg total) by mouth daily before breakfast. 90 tablet 1   Estradiol 10 MCG TABS vaginal tablet Place 0.5 tablets (5 mcg total) vaginally once a week. 8 tablet 0   insulin  glargine, 2 Unit Dial, (TOUJEO  MAX SOLOSTAR) 300 UNIT/ML Solostar Pen 20  U daily. Increase by 2 U every 3 days until sugars less than 140. 3 mL 1   Insulin  Pen Needle (PEN NEEDLES) 32G X 6 MM MISC Inject insulin  as directed. 100 each 11   Insulin  Syringe-Needle U-100 28G X 5/16 1 ML MISC 30 Units by Does not apply route daily. 100 each 3   metFORMIN  (GLUCOPHAGE ) 1000 MG tablet Take 1 tablet (1,000 mg total) by mouth 2 (two) times daily with a meal. 180 tablet 1   nystatin cream (MYCOSTATIN) Apply 1 Application topically 2 (two) times daily. (Patient taking differently: Apply 1 Application topically 2 (two) times daily. AS NEEDED) 30 g 0   pantoprazole  (PROTONIX ) 40 MG tablet Take 1 tablet (40 mg total) by mouth daily. 90 tablet 1   amphetamine -dextroamphetamine  (ADDERALL) 10 MG tablet Take 1 tablet (10 mg total) by mouth daily with breakfast. (Patient not taking: Reported on 12/17/2023) 30 tablet 0   citalopram  (CELEXA ) 40 MG tablet Take 1 tablet (40 mg total) by mouth daily. (Patient not taking: Reported on 12/17/2023) 90 tablet 0   Continuous Glucose Sensor (DEXCOM G7 SENSOR) MISC Change every 10 days. (Patient not taking: Reported on 12/17/2023) 9 each 3   insulin  aspart (NOVOLOG ) 100 UNIT/ML injection Inject 10 Units into the skin 3 (three) times daily before meals. (Patient not taking: Reported on 12/17/2023) 10 mL PRN   ondansetron  (ZOFRAN ) 4 MG tablet Take 1 tablet (4 mg total) by mouth every 8 (eight) hours as needed for nausea or vomiting. (Patient not taking: Reported on 12/17/2023) 40 tablet 0   rosuvastatin  (CRESTOR ) 10 MG tablet Take 1 tablet (10 mg total) by mouth daily. (Patient not taking: Reported on 12/17/2023) 90 tablet 1   valACYclovir  (VALTREX ) 500 MG tablet Take 1 tablet (500 mg total) by mouth daily. (Patient not taking: Reported on 12/17/2023) 30 tablet 6   Vitamin D , Ergocalciferol , (DRISDOL ) 1.25 MG (50000 UNIT) CAPS capsule Take 1 capsule (50,000 Units total) by mouth every 7 (seven) days. (Patient not taking: Reported on 12/17/2023) 12 capsule 0    No current facility-administered medications on file prior to visit.   Past Medical History:  Diagnosis Date   Antepartum mild preeclampsia 11/14/2016   Formatting of this note might be different from the original. Seen for repeat BP check in triage 12/04/16 Rescue BMZ 10/8 - 10/9  HELLP labs normal/stable 12/16/16, except urine P/C 10.7 Dr. Leontine spoke with Dr. Glendale Right (MFM) on 12/18/16 regarding patients clinical care with severe BP on 10/19 (though not 4h apart) and worsening proteinuria with urine P/C of 10.7 (previously 0.3 in August).    Migraine without aura and without status migrainosus, not intractable 07/02/2019   Pregnancy induced hypertension    Rh negative state  in antepartum period 06/09/2016   Short cervix affecting pregnancy 09/26/2016   Past Surgical History:  Procedure Laterality Date   CESAREAN SECTION  12/20/2016   CHOLECYSTECTOMY, LAPAROSCOPIC  06/23/2022   Dr Joesph   DILATION AND CURETTAGE OF UTERUS     WISDOM TOOTH EXTRACTION      Family History  Problem Relation Age of Onset   Hyperlipidemia Mother    Diabetes Mother    Diabetes Maternal Uncle    Diabetes Maternal Grandmother    Social History   Socioeconomic History   Marital status: Single    Spouse name: Not on file   Number of children: 2   Years of education: Not on file   Highest education level: Not on file  Occupational History   Occupation: CNA  Tobacco Use   Smoking status: Never   Smokeless tobacco: Never  Vaping Use   Vaping status: Never Used  Substance and Sexual Activity   Alcohol use: Not Currently    Alcohol/week: 2.0 standard drinks of alcohol    Types: 2 Shots of liquor per week    Comment: Every 6 months   Drug use: No   Sexual activity: Not Currently    Birth control/protection: None  Other Topics Concern   Not on file  Social History Narrative   Not on file   Social Drivers of Health   Financial Resource Strain: Patient Declined (11/28/2023)   Overall  Financial Resource Strain (CARDIA)    Difficulty of Paying Living Expenses: Patient declined  Food Insecurity: Patient Declined (11/28/2023)   Hunger Vital Sign    Worried About Running Out of Food in the Last Year: Patient declined    Ran Out of Food in the Last Year: Patient declined  Transportation Needs: Patient Declined (11/28/2023)   PRAPARE - Administrator, Civil Service (Medical): Patient declined    Lack of Transportation (Non-Medical): Patient declined  Physical Activity: Inactive (05/01/2022)   Exercise Vital Sign    Days of Exercise per Week: 0 days    Minutes of Exercise per Session: 0 min  Stress: No Stress Concern Present (05/01/2022)   [employer] of Occupational Health - Occupational Stress Questionnaire    Feeling of Stress : Not at all  Social Connections: Unknown (11/28/2023)   Social Connection and Isolation Panel    Frequency of Communication with Friends and Family: More than three times a week    Frequency of Social Gatherings with Friends and Family: Patient declined    Attends Religious Services: Patient declined    Database administrator or Organizations: Patient declined    Attends Engineer, structural: Not on file    Marital Status: Patient declined    Objective:  BP 102/68   Pulse 81   Temp 98 F (36.7 C) (Temporal)   Resp 16   Ht 5' 3 (1.6 m)   Wt 231 lb 6.4 oz (105 kg)   LMP 12/09/2023 (Exact Date)   SpO2 97%   BMI 40.99 kg/m      12/17/2023    7:59 AM 09/10/2023    2:32 PM 05/22/2023    3:52 PM  BP/Weight  Systolic BP 102 112 110  Diastolic BP 68 82 70  Wt. (Lbs) 231.4 225 233  BMI 40.99 kg/m2 39.86 kg/m2 41.27 kg/m2    Physical Exam Vitals reviewed.  Constitutional:      General: She is not in acute distress.    Appearance: Normal appearance. She is obese.  She is not ill-appearing.  Eyes:     Conjunctiva/sclera: Conjunctivae normal.  Cardiovascular:     Rate and Rhythm: Normal rate and regular rhythm.      Heart sounds: Normal heart sounds. No murmur heard. Pulmonary:     Effort: Pulmonary effort is normal.     Breath sounds: Normal breath sounds. No wheezing.  Abdominal:     General: Bowel sounds are normal.     Palpations: Abdomen is soft.     Tenderness: There is no abdominal tenderness.  Skin:    Findings: Rash (under arms) present.  Neurological:     Mental Status: She is alert. Mental status is at baseline.  Psychiatric:        Mood and Affect: Mood normal.        Behavior: Behavior normal.     Diabetic foot exam was performed with the following findings:   No deformities, ulcerations, or other skin breakdown Normal sensation of 10g monofilament Intact posterior tibialis and dorsalis pedis pulses      Lab Results  Component Value Date   WBC 23.6 (HH) 09/10/2023   HGB 13.4 09/10/2023   HCT 42.8 09/10/2023   PLT 352 09/10/2023   GLUCOSE 156 (A) 09/10/2023   CHOL 259 (H) 09/10/2023   TRIG 298 (H) 09/10/2023   HDL 35 (L) 09/10/2023   LDLCALC 167 (H) 09/10/2023   ALT 66 (H) 09/10/2023   AST 21 09/10/2023   NA 134 09/10/2023   K 4.7 09/10/2023   CL 97 09/10/2023   CREATININE 0.84 09/10/2023   BUN 10 09/10/2023   CO2 19 (L) 09/10/2023   TSH 4.090 02/15/2023   HGBA1C 7.1 12/17/2023    Results for orders placed or performed in visit on 12/17/23  POCT Lipid Panel   Collection Time: 12/17/23  8:31 AM  Result Value Ref Range   TC 241    HDL 35    TRG 197    LDL 166    Non-HDL 205    TC/HDL 4.7    Glucose 177   POCT glycosylated hemoglobin (Hb A1C)   Collection Time: 12/17/23  8:32 AM  Result Value Ref Range   Hemoglobin A1C     HbA1c POC (<> result, manual entry) 7.1 4.0 - 5.6 %   HbA1c, POC (prediabetic range)     HbA1c, POC (controlled diabetic range)    .  Assessment & Plan:   Assessment & Plan Uncontrolled type 2 diabetes mellitus with hyperglycemia, without long-term current use of insulin  (HCC) Improved Control: good Recommend check sugars  fasting daily. Recommend check feet daily. Recommend annual eye exams. Medicines: Continue Novolog , 10 units before meals, adjust based on glucose readings and Toujeo  20 units daily Continue to work on eating a healthy diet and exercise. Use Dexcom for glucose monitoring. Labs drawn today.    Lab Results  Component Value Date   HGBA1C 7.1 12/17/2023   Orders:   POCT glycosylated hemoglobin (Hb A1C)   Microalbumin / creatinine urine ratio   Mixed hyperlipidemia Uncontrolled Last lipids Lab Results  Component Value Date   CHOL 259 (H) 09/10/2023   HDL 35 (L) 09/10/2023   LDLCALC 167 (H) 09/10/2023   TRIG 298 (H) 09/10/2023   CHOLHDL 7.4 (H) 09/10/2023    - No major side effects or issues taking the Crestor  10 mg but reports that she stopped taking medication - Labs drawn today, Await labs/testing for assessment and recommendations Orders:   POCT Lipid Panel   Comprehensive  metabolic panel with GFR   Candidal intertrigo Improved  Not resolved. She has tried several topical treatments with some improvement.  - Reports the Farxiga  is most likely the cause of recent flare. Orders:   fluconazole (DIFLUCAN) 150 MG tablet; Take 1 tablet (150 mg total) by mouth daily for 3 doses.  Morbid obesity (HCC) BMI 40.99, not at goal Has gained 6 pounds since last visit.  - Continue to eat healthy - Aim to do some physical activity for 150 minutes per week. This is typically divided into 5 days per week, 30 minutes per day. The activity should be enough to get your heart rate up. Anything is better than nothing if you have time constraints.       Meds ordered this encounter  Medications   fluconazole (DIFLUCAN) 150 MG tablet    Sig: Take 1 tablet (150 mg total) by mouth daily for 3 doses.    Dispense:  3 tablet    Refill:  0    Orders Placed This Encounter  Procedures   Comprehensive metabolic panel with GFR   Microalbumin / creatinine urine ratio   POCT glycosylated hemoglobin  (Hb A1C)   POCT Lipid Panel       Follow-up: Return in about 3 months (around 03/18/2024).  An After Visit Summary was printed and given to the patient.  Harrie Cedar, FNP [facility] [phone]

## 2023-12-18 LAB — COMPREHENSIVE METABOLIC PANEL WITH GFR
ALT: 112 IU/L — ABNORMAL HIGH (ref 0–32)
AST: 60 IU/L — ABNORMAL HIGH (ref 0–40)
Albumin: 4.4 g/dL (ref 3.9–4.9)
Alkaline Phosphatase: 80 IU/L (ref 41–116)
BUN/Creatinine Ratio: 19 (ref 9–23)
BUN: 15 mg/dL (ref 6–20)
Bilirubin Total: 0.4 mg/dL (ref 0.0–1.2)
CO2: 22 mmol/L (ref 20–29)
Calcium: 9.7 mg/dL (ref 8.7–10.2)
Chloride: 103 mmol/L (ref 96–106)
Creatinine, Ser: 0.79 mg/dL (ref 0.57–1.00)
Globulin, Total: 2.7 g/dL (ref 1.5–4.5)
Glucose: 154 mg/dL — ABNORMAL HIGH (ref 70–99)
Potassium: 4.5 mmol/L (ref 3.5–5.2)
Sodium: 137 mmol/L (ref 134–144)
Total Protein: 7.1 g/dL (ref 6.0–8.5)
eGFR: 101 mL/min/1.73 (ref >59)

## 2023-12-18 LAB — MICROALBUMIN / CREATININE URINE RATIO
Creatinine, Urine: 83.8 mg/dL
Microalb/Creat Ratio: 9 mg/g{creat} (ref 0–29)
Microalbumin, Urine: 7.9 ug/mL

## 2023-12-21 ENCOUNTER — Ambulatory Visit: Payer: Self-pay | Admitting: Family Medicine

## 2024-03-20 ENCOUNTER — Ambulatory Visit: Admitting: Family Medicine

## 2024-04-10 ENCOUNTER — Ambulatory Visit: Admitting: Family Medicine
